# Patient Record
Sex: Female | Born: 1960 | Race: Black or African American | Hispanic: No | Marital: Single | Smoking: Never smoker
Health system: Southern US, Community
[De-identification: ages and names within clinical notes are randomized; demographics above are authoritative.]

## PROBLEM LIST (undated history)

## (undated) DIAGNOSIS — I10 Essential (primary) hypertension: Secondary | ICD-10-CM

## (undated) DIAGNOSIS — G809 Cerebral palsy, unspecified: Secondary | ICD-10-CM

## (undated) DIAGNOSIS — E7800 Pure hypercholesterolemia, unspecified: Secondary | ICD-10-CM

## (undated) DIAGNOSIS — E119 Type 2 diabetes mellitus without complications: Secondary | ICD-10-CM

## (undated) HISTORY — PX: DILATION AND CURETTAGE OF UTERUS: SHX78

---

## 2004-01-10 ENCOUNTER — Other Ambulatory Visit: Payer: Self-pay

## 2004-01-10 ENCOUNTER — Ambulatory Visit: Payer: Self-pay | Admitting: General Surgery

## 2004-01-11 ENCOUNTER — Ambulatory Visit: Payer: Self-pay | Admitting: General Surgery

## 2006-02-18 ENCOUNTER — Other Ambulatory Visit: Payer: Self-pay

## 2006-02-18 ENCOUNTER — Emergency Department: Payer: Self-pay | Admitting: Emergency Medicine

## 2006-09-19 ENCOUNTER — Ambulatory Visit: Payer: Self-pay | Admitting: Internal Medicine

## 2006-09-24 ENCOUNTER — Ambulatory Visit: Payer: Self-pay | Admitting: Internal Medicine

## 2007-02-24 ENCOUNTER — Other Ambulatory Visit: Payer: Self-pay

## 2007-02-24 ENCOUNTER — Ambulatory Visit: Payer: Self-pay | Admitting: Unknown Physician Specialty

## 2007-03-04 ENCOUNTER — Ambulatory Visit: Payer: Self-pay | Admitting: Unknown Physician Specialty

## 2007-06-23 ENCOUNTER — Ambulatory Visit: Payer: Self-pay | Admitting: Internal Medicine

## 2008-06-16 ENCOUNTER — Ambulatory Visit: Payer: Self-pay | Admitting: Internal Medicine

## 2009-10-20 ENCOUNTER — Ambulatory Visit: Payer: Self-pay | Admitting: Internal Medicine

## 2010-12-19 ENCOUNTER — Ambulatory Visit: Payer: Self-pay | Admitting: Internal Medicine

## 2011-12-20 ENCOUNTER — Ambulatory Visit: Payer: Self-pay | Admitting: Internal Medicine

## 2012-11-25 ENCOUNTER — Ambulatory Visit: Payer: Self-pay | Admitting: Obstetrics and Gynecology

## 2012-11-25 DIAGNOSIS — I10 Essential (primary) hypertension: Secondary | ICD-10-CM

## 2012-11-25 LAB — BASIC METABOLIC PANEL
Anion Gap: 8 (ref 7–16)
Calcium, Total: 9.1 mg/dL (ref 8.5–10.1)
Chloride: 99 mmol/L (ref 98–107)
Co2: 28 mmol/L (ref 21–32)
Creatinine: 0.64 mg/dL (ref 0.60–1.30)
EGFR (African American): >60
EGFR (Non-African Amer.): >60
Glucose: 254 mg/dL — ABNORMAL HIGH (ref 65–99)

## 2012-11-25 LAB — URINALYSIS, COMPLETE
Bacteria: NONE SEEN
Bilirubin,UR: NEGATIVE
Blood: NEGATIVE
Glucose,UR: >=500 mg/dL
Ketone: NEGATIVE
Leukocyte Esterase: NEGATIVE
Nitrite: NEGATIVE
Ph: 5
Protein: NEGATIVE
RBC,UR: 1 /HPF
Specific Gravity: 1.002
Squamous Epithelial: 1
WBC UR: 1 /HPF

## 2012-11-25 LAB — HEMOGLOBIN: HGB: 12.3 g/dL (ref 12.0–16.0)

## 2012-11-28 ENCOUNTER — Ambulatory Visit: Payer: Self-pay | Admitting: Obstetrics and Gynecology

## 2012-12-22 ENCOUNTER — Ambulatory Visit: Payer: Self-pay | Admitting: Obstetrics and Gynecology

## 2013-12-24 DIAGNOSIS — I10 Essential (primary) hypertension: Secondary | ICD-10-CM | POA: Insufficient documentation

## 2013-12-24 DIAGNOSIS — E119 Type 2 diabetes mellitus without complications: Secondary | ICD-10-CM | POA: Insufficient documentation

## 2013-12-24 DIAGNOSIS — G809 Cerebral palsy, unspecified: Secondary | ICD-10-CM | POA: Insufficient documentation

## 2013-12-24 DIAGNOSIS — E785 Hyperlipidemia, unspecified: Secondary | ICD-10-CM | POA: Insufficient documentation

## 2013-12-24 DIAGNOSIS — E669 Obesity, unspecified: Secondary | ICD-10-CM | POA: Insufficient documentation

## 2013-12-24 DIAGNOSIS — G629 Polyneuropathy, unspecified: Secondary | ICD-10-CM | POA: Insufficient documentation

## 2014-03-04 ENCOUNTER — Ambulatory Visit: Payer: Self-pay | Admitting: Internal Medicine

## 2014-05-28 ENCOUNTER — Ambulatory Visit: Payer: Self-pay | Admitting: Unknown Physician Specialty

## 2014-07-23 NOTE — Op Note (Signed)
PATIENT NAME:  Rachel [name], Rachel F MR#:  [name] DATE OF BIRTH:  01-29-1961  DATE OF PROCEDURE:  11/28/2012  PREOPERATIVE DIAGNOSES:  Polymenorrhea and menorrhagia.  POSTOPERATIVE DIAGNOSES:   Polymenorrhea and menorrhagia.  PROCEDURES: 1.  Hysteroscopy.  2.  Dilation and curettage. 3.  Attempted dermal ablation - failed cavity test.   SURGEON: Ricky L. Logan BoresEvans, M.D.   ANESTHESIA: General orotracheal.   SPECIMENS: Endometrial curettings.    FINDINGS:  Include a stenotic cervix. Approximately 100 mL of old dark Coutant blood that was released upon dilation of cervix. Hysteroscopy nonspecific.  Cavity length was 6.5 cm with leakage of CO2 at the cervical os causing cavity test to fail; unable to resolve with Vaseline gauze.   ESTIMATED BLOOD LOSS: Minimal.   COMPLICATIONS: None.   DRAINS: Red rubber at the end of the case. Approximately 200 mL of urine.   PROCEDURE IN DETAIL: The patient was consented. Preoperative antibiotics were given. She was taken to the operating room and placed in supine position where anesthesia was initiated and placed in dorsal lithotomy position using Allen stirrups. She was prepped and draped.  The cervix was visualized and grasped with a single-tooth tenaculum. Stenosis was overcome with the smallest dilators and easily dilated up to a 3617 [place] with hysteroscopic findings showing what appeared to be a large amount of old blood.  The old blood was drained. Polyp forceps and sharp curette were used to obtained portions of minimal to moderate amount of endometrium.  Then attempted NovaSure, but again failed the cavity test despite Vaseline gauze with persistent leakage around the cervix.    Sharp curetting was performed again with minimal return of tissue. The instruments were removed. The cervix seen to be hemostatic. The patient was returned to the supine position and back to the care of anesthesia.  The patient tolerated the procedure well. I suspect that a lot of  her prolonged bleeding was this cervical stenosis and this old trapped blood, most likely left over from her years on Provera and most likely the above procedure will resolve the problem.    She will be discharged home with routine prescriptions, precautions, and follow-up    ____________________________ Clide Clifficky L. Logan BoresEvans, [name] rle:dp D: 11/28/2012 08:15:37 ET T: 11/28/2012 10:00:14 ET JOB#: [id number]  cc: Ricky L. Logan BoresEvans, [name], <Dictator> Rachel [name] ELECTRONICALLY SIGNED 12/02/2012 9:15

## 2014-07-26 LAB — SURGICAL PATHOLOGY

## 2015-02-22 ENCOUNTER — Ambulatory Visit (INDEPENDENT_AMBULATORY_CARE_PROVIDER_SITE_OTHER): Payer: Medicare Other | Admitting: Podiatry

## 2015-02-22 ENCOUNTER — Encounter: Payer: Self-pay | Admitting: Podiatry

## 2015-02-22 ENCOUNTER — Ambulatory Visit (INDEPENDENT_AMBULATORY_CARE_PROVIDER_SITE_OTHER): Payer: Medicare Other

## 2015-02-22 VITALS — BP 164/96 | HR 93 | Resp 18

## 2015-02-22 DIAGNOSIS — R52 Pain, unspecified: Secondary | ICD-10-CM | POA: Diagnosis not present

## 2015-02-22 DIAGNOSIS — E1149 Type 2 diabetes mellitus with other diabetic neurological complication: Secondary | ICD-10-CM | POA: Diagnosis not present

## 2015-02-22 DIAGNOSIS — B351 Tinea unguium: Secondary | ICD-10-CM

## 2015-02-22 DIAGNOSIS — M19072 Primary osteoarthritis, left ankle and foot: Secondary | ICD-10-CM

## 2015-02-22 DIAGNOSIS — M79676 Pain in unspecified toe(s): Secondary | ICD-10-CM

## 2015-02-22 NOTE — Progress Notes (Signed)
   Subjective:    Patient ID: Rachel [name], female    DOB: 09-07-1960, 54 y.o.   MRN: [id number]  HPI  ETT-year-old female presents the office today with a caregiver for concerns of thick, painful, elongated toenails for which she is unable to trim herself. She also states that over the last couple months she has been having some pain in the top of her left foot mostly with weightbearing and standing for prolonged periods of time. She does get some burning to both of her feet but denies numbness. She denies any claudication symptoms. No other complaints at this time.   Review of Systems  Endocrine:       Excessive thirst   Neurological: Positive for headaches.  All other systems reviewed and are negative.      Objective:   Physical Exam General: AAO x3, NAD  Dermatological: Skin is warm, dry and supple bilateral. Nails are hypertrophic, dystrophic, brittle, discolored, elongated 10. There is tenderness to palpation overlying nails 1-5 bilaterally. No surrounding erythema or drainage. There are no open sores, no preulcerative lesions, no rash or signs of infection present. There are multiple discolored dark, uniform in color with irregular border dark patches the bottoms of her feet. There is no skin breakdown overlying the area and subjective this is not changed in quite some time.  Vascular: Dorsalis Pedis artery and Posterior Tibial artery pedal pulses are 2/4 bilateral with immedate capillary fill time. Pedal hair growth present. No varicosities and no lower extremity edema present bilateral. There is no pain with calf compression, swelling, warmth, erythema.   Neruologic: Sensation decreased with Dorann OuSimms Weinstein monofilament, vibratory sensation decreased, Achilles tendon reflex intact  Musculoskeletal: There is mild tenderness on palpation along the dorsal aspect left mid foot mostly on the medial aspect. There is no specific area pinpoint bony tenderness there is no pain vibratory  sensation. No other areas of tenderness to bilateral lower extremities. No gross boney pedal deformities bilateral. No pain, crepitus, or limitation noted with foot and ankle range of motion bilateral. Muscular strength 5/5 in all groups tested bilateral.  Gait: Unassisted, Nonantalgic.      Assessment & Plan:  54 year old female with symptomatic onychomycosis, left midfoot arthritis -X-rays were obtained and reviewed with the patient. Arthritic changes are present in the midfoot. No definitive acute fracture stress fracture. -Treatment options discussed including all alternatives, risks, and complications -Etiology of symptoms were discussed -Nails debrided 10 without complication/bleeding -Recommend diabetic shoes and inserts. Prescription for this was given to her for [employer]. -Discussed daily foot inspection -Follow-up with PCP in regards to possible started treatment for neuropathy. Also follow up other issues mentioned in the review of systems. -Follow-up in 3 months or after shoes/inserts if midfoot pain continues or sooner if any problems arise. In the meantime, encouraged to call the office with any questions, concerns, change in symptoms.   Ovid CurdMatthew Gideon Burstein, DPM

## 2015-05-27 ENCOUNTER — Encounter: Payer: Self-pay | Admitting: Emergency Medicine

## 2015-05-27 ENCOUNTER — Emergency Department: Payer: Medicare Other

## 2015-05-27 ENCOUNTER — Emergency Department
Admission: EM | Admit: 2015-05-27 | Discharge: 2015-05-27 | Disposition: A | Discharge status: Home / Self Care | Payer: Medicare Other | Attending: Student | Admitting: Student

## 2015-05-27 DIAGNOSIS — W01198A Fall on same level from slipping, tripping and stumbling with subsequent striking against other object, initial encounter: Secondary | ICD-10-CM | POA: Insufficient documentation

## 2015-05-27 DIAGNOSIS — W19XXXA Unspecified fall, initial encounter: Secondary | ICD-10-CM

## 2015-05-27 DIAGNOSIS — Y92129 Unspecified place in nursing home as the place of occurrence of the external cause: Secondary | ICD-10-CM | POA: Insufficient documentation

## 2015-05-27 DIAGNOSIS — F79 Unspecified intellectual disabilities: Secondary | ICD-10-CM | POA: Diagnosis not present

## 2015-05-27 DIAGNOSIS — R42 Dizziness and giddiness: Secondary | ICD-10-CM

## 2015-05-27 DIAGNOSIS — Z79899 Other long term (current) drug therapy: Secondary | ICD-10-CM | POA: Insufficient documentation

## 2015-05-27 DIAGNOSIS — S0990XA Unspecified injury of head, initial encounter: Secondary | ICD-10-CM | POA: Diagnosis not present

## 2015-05-27 DIAGNOSIS — Y9389 Activity, other specified: Secondary | ICD-10-CM | POA: Insufficient documentation

## 2015-05-27 DIAGNOSIS — Z3202 Encounter for pregnancy test, result negative: Secondary | ICD-10-CM | POA: Insufficient documentation

## 2015-05-27 DIAGNOSIS — E119 Type 2 diabetes mellitus without complications: Secondary | ICD-10-CM | POA: Diagnosis not present

## 2015-05-27 DIAGNOSIS — Z7984 Long term (current) use of oral hypoglycemic drugs: Secondary | ICD-10-CM | POA: Diagnosis not present

## 2015-05-27 DIAGNOSIS — Y998 Other external cause status: Secondary | ICD-10-CM | POA: Insufficient documentation

## 2015-05-27 DIAGNOSIS — R002 Palpitations: Secondary | ICD-10-CM | POA: Insufficient documentation

## 2015-05-27 DIAGNOSIS — Z794 Long term (current) use of insulin: Secondary | ICD-10-CM | POA: Diagnosis not present

## 2015-05-27 DIAGNOSIS — I10 Essential (primary) hypertension: Secondary | ICD-10-CM | POA: Diagnosis not present

## 2015-05-27 HISTORY — DX: Type 2 diabetes mellitus without complications: E11.9

## 2015-05-27 HISTORY — DX: Pure hypercholesterolemia, unspecified: E78.00

## 2015-05-27 HISTORY — DX: Essential (primary) hypertension: I10

## 2015-05-27 LAB — URINALYSIS COMPLETE WITH MICROSCOPIC (ARMC ONLY)
Bilirubin Urine: NEGATIVE
Glucose, UA: >500 mg/dL — AB
Hgb urine dipstick: NEGATIVE
Ketones, ur: NEGATIVE mg/dL
LEUKOCYTES UA: NEGATIVE
Nitrite: NEGATIVE
PH: 7 (ref 5.0–8.0)
PROTEIN: NEGATIVE mg/dL
Specific Gravity, Urine: 1.002 — ABNORMAL LOW (ref 1.005–1.030)

## 2015-05-27 LAB — HCG, QUANTITATIVE, PREGNANCY: hCG, Beta Chain, Quant, S: <1 m[IU]/mL (ref <5)

## 2015-05-27 LAB — CBC
HCT: 35 % (ref 35.0–47.0)
HEMOGLOBIN: 11.6 g/dL — AB (ref 12.0–16.0)
MCH: 31.4 pg (ref 26.0–34.0)
MCHC: 33.1 g/dL (ref 32.0–36.0)
MCV: 94.7 fL (ref 80.0–100.0)
PLATELETS: 235 10*3/uL (ref 150–440)
RBC: 3.69 MIL/uL — AB (ref 3.80–5.20)
RDW: 12.8 % (ref 11.5–14.5)
WBC: 7.3 10*3/uL (ref 3.6–11.0)

## 2015-05-27 LAB — COMPREHENSIVE METABOLIC PANEL
ALK PHOS: 90 U/L (ref 38–126)
ALT: 17 U/L (ref 14–54)
AST: 23 U/L (ref 15–41)
Albumin: 3.9 g/dL (ref 3.5–5.0)
Anion gap: 8 (ref 5–15)
BUN: 8 mg/dL (ref 6–20)
CHLORIDE: 97 mmol/L — AB (ref 101–111)
CO2: 27 mmol/L (ref 22–32)
CREATININE: 0.74 mg/dL (ref 0.44–1.00)
Calcium: 8.9 mg/dL (ref 8.9–10.3)
GFR calc Af Amer: >60 mL/min (ref >60)
Glucose, Bld: 242 mg/dL — ABNORMAL HIGH (ref 65–99)
Potassium: 4 mmol/L (ref 3.5–5.1)
Sodium: 132 mmol/L — ABNORMAL LOW (ref 135–145)
Total Bilirubin: 0.4 mg/dL (ref 0.3–1.2)
Total Protein: 7.3 g/dL (ref 6.5–8.1)

## 2015-05-27 LAB — GLUCOSE, CAPILLARY: GLUCOSE-CAPILLARY: 235 mg/dL — AB (ref 65–99)

## 2015-05-27 LAB — TROPONIN I

## 2015-05-27 NOTE — ED Notes (Signed)
Patient from "[facility]" group home. Patient fell this morning at 0620 striking her head. Group home sent her to [facility] for evaluation. [facility] directed her to the ED for evaluation of Hypoglycemia (50). However, patient states she had an acute onset of chest pain and palpitations resulting in dizziness/lightheadedness prior to the fall.

## 2015-05-27 NOTE — ED Provider Notes (Signed)
[facility] Emergency Department Provider Note  ____________________________________________  Time seen: Approximately 1:00 PM  I have reviewed the triage vital signs and the nursing notes.   HISTORY  Chief Complaint Dizziness; Chest Pain; Palpitations; Fall; and Head Injury  Caveat-history of present illness and review of systems Limited due to the patient's mental retardation. Information is obtained partially from the patient as well as her caregiver at bedside.  HPI Rachel [name] is a 55 y.o. female with history of mental retardation, hyperlipidemia, hypertension, diabetes presents for evaluation of a fall which occurred this morning suddenly, gradual onset, now resolved, initially moderate to severe. The caregiver reports that she heard a "thud" in the next room at the group home. She went into the patient's room and found her awake on the floor and she appeared to have hit her head. The patient reported to the caregiver that she was trying to get a pencil off of the TV when she got dizzy and her heart was fluttering. She is unable to specify whether not she was lightheaded or whether or not this was room spinning dizziness. Patient denies any chest pain specifically. Currently she reports she feels well. There was no loss of consciousness. The patient has otherwise been in her usual state of health without illness. No vomiting, diarrhea, fevers or chills.   Past Medical History  Diagnosis Date  . Diabetes mellitus without complication (HCC)   . Hypertension   . Hypercholesteremia     There are no active problems to display for this patient.   History reviewed. No pertinent past surgical history.  Current Outpatient Rx  Name  Route  Sig  Dispense  Refill  . amLODipine (NORVASC) 10 MG tablet   Oral   Take by mouth.         Marland Kitchen amLODipine (NORVASC) 10 MG tablet               . amLODipine (NORVASC) 5 MG tablet               . ARIPiprazole  (ABILIFY) 10 MG tablet   Oral   Take by mouth.         . carbamazepine (TEGRETOL) 200 MG tablet   Oral   Take by mouth.         . chlorthalidone (HYGROTON) 25 MG tablet      1 tab PO QOD         . furosemide (LASIX) 40 MG tablet   Oral   Take by mouth.         Marland Kitchen glipiZIDE (GLUCOTROL XL) 10 MG 24 hr tablet   Oral   Take by mouth.         . Insulin Glargine (LANTUS SOLOSTAR) 100 UNIT/ML Solostar Pen      INJECT 40 UNITS SQ EVERY NIGHT AT BEDTIME. (DIABETES)         . Insulin Pen Needle (BD AUTOSHIELD DUO) 30G X 5 MM MISC      Use 1 Pen Needle once daily.         Marland Kitchen loratadine (CLARITIN) 10 MG tablet   Oral   Take by mouth.         . losartan (COZAAR) 100 MG tablet   Oral   Take by mouth.         . losartan (COZAAR) 50 MG tablet               . magnesium oxide (MAG-OX) 400 MG tablet  Oral   Take by mouth.         . medroxyPROGESTERone (DEPO-PROVERA) 150 MG/ML injection   Intramuscular   Inject into the muscle.         . medroxyPROGESTERone (DEPO-PROVERA) 150 MG/ML injection               . metFORMIN (GLUCOPHAGE) 1000 MG tablet   Oral   Take by mouth.         . metFORMIN (GLUCOPHAGE) 1000 MG tablet               . Multiple Vitamin (THEREMS) TABS   Oral   Take by mouth.         . nystatin cream (MYCOSTATIN)               . nystatin-triamcinolone (MYCOLOG II) cream   Topical   Apply topically.         . pioglitazone (ACTOS) 30 MG tablet   Oral   Take by mouth.         . potassium chloride SA (K-DUR,KLOR-CON) 20 MEQ tablet   Oral   Take by mouth.         . pravastatin (PRAVACHOL) 80 MG tablet      TAKE ONE TABLET BY MOUTH AT BEDTIME. (IMPROVES CHOLESTEROL)         . prazosin (MINIPRESS) 2 MG capsule   Oral   Take by mouth.         . ranitidine (ZANTAC) 150 MG tablet      TAKE ONE TABLET BY MOUTH TWICE DAILY FOR STOMACH         . ranitidine (ZANTAC) 150 MG tablet               .  senna-docusate (DOC-Q-LAX) 8.6-50 MG tablet   Oral   Take by mouth.         . triamcinolone cream (KENALOG) 0.1 %                 Allergies Review of patient's allergies indicates no known allergies.  History reviewed. No pertinent family history.  Social History Social History  Substance Use Topics  . Smoking status: Never Smoker   . Smokeless tobacco: Never Used  . Alcohol Use: None    Review of Systems Constitutional: No fever/chills Cardiovascular: Denies chest pain. Respiratory: Denies shortness of breath. Gastrointestinal: No abdominal pain.  No nausea, no vomiting.  No diarrhea.   Genitourinary: Negative for dysuria. Skin: Negative for rash.  Caveat-history of present illness and review of systems Limited due to the patient's mental retardation. Information is obtained partially from the patient as well as her caregiver at bedside. ____________________________________________   PHYSICAL EXAM:  VITAL SIGNS: ED Triage Vitals  Enc Vitals Group     BP 05/27/15 1048 164/70 mmHg     Pulse Rate 05/27/15 1048 95     Resp 05/27/15 1048 18     Temp 05/27/15 1048 98.1 F (36.7 C)     Temp Source 05/27/15 1048 Oral     SpO2 05/27/15 1048 100 %     Weight 05/27/15 1048 221 lb (100.245 kg)     Height --      Head Cir --      Peak Flow --      Pain Score 05/27/15 1239 0     Pain Loc --      Pain Edu? --      Excl. in GC? --  Constitutional: Alert and oriented to self and place but not to year, which is her baseline. She does appear cognitively impaired however she participates with the exam and is able to answer most simple questions appropriately though she does often give inconsistent answers. Eyes: Conjunctivae are normal. PERRL. EOMI. Head: Atraumatic. Nose: No congestion/rhinnorhea. Mouth/Throat: Mucous membranes are moist.  Oropharynx non-erythematous. Neck: No stridor.  No cervical spine tenderness to palpation. Cardiovascular: Normal rate, regular  rhythm. Grossly normal heart sounds.  Good peripheral circulation. Respiratory: Normal respiratory effort.  No retractions. Lungs CTAB. Gastrointestinal: Soft and nontender. No distention.  No CVA tenderness. Genitourinary: deferred Musculoskeletal: No lower extremity tenderness nor edema.  No joint effusions. Neurologic:  Normal speech and language. No gross focal neurologic deficits are appreciated. No gait instability. 5 out of 5 strength in bilateral upper and lower extremities, sensation intact to light touch throughout. Cranial nerves II through XII intact. Normal finger-nose-finger. Skin:  Skin is warm, dry and intact. No rash noted. Psychiatric: Mood and affect are normal. Speech and behavior are normal.  ____________________________________________   LABS (all labs ordered are listed, but only abnormal results are displayed)  Labs Reviewed  GLUCOSE, CAPILLARY - Abnormal; Notable for the following:    Glucose-Capillary 235 (*)    All other components within normal limits  CBC - Abnormal; Notable for the following:    RBC 3.69 (*)    Hemoglobin 11.6 (*)    All other components within normal limits  COMPREHENSIVE METABOLIC PANEL - Abnormal; Notable for the following:    Sodium 132 (*)    Chloride 97 (*)    Glucose, Bld 242 (*)    All other components within normal limits  URINALYSIS COMPLETEWITH MICROSCOPIC ([facility] ONLY) - Abnormal; Notable for the following:    Color, Urine STRAW (*)    APPearance CLEAR (*)    Glucose, UA >500 (*)    Specific Gravity, Urine 1.002 (*)    Bacteria, UA RARE (*)    Squamous Epithelial / LPF 0-5 (*)    All other components within normal limits  TROPONIN I  HCG, QUANTITATIVE, PREGNANCY   ____________________________________________  EKG  ED ECG REPORT I, Gayla Doss, the attending physician, personally viewed and interpreted this ECG.   Date: 05/27/2015  EKG Time: 11:02  Rate: 97  Rhythm: normal sinus rhythm  Axis: normal   Intervals:none  ST&T Change: No acute ST elevation. Q waves in lead 3, V3 are chronic. There is a Q wave noted in aVF however overall the appearance of the EKG is similar to that obtained on 11/25/2012.  ____________________________________________  RADIOLOGY  CT head  IMPRESSION: No acute intracranial abnormality.  CXR IMPRESSION: No acute cardiopulmonary disease.  ____________________________________________   PROCEDURES  Procedure(s) performed: None  Critical Care performed: No  ____________________________________________   INITIAL IMPRESSION / ASSESSMENT AND PLAN / ED COURSE  Pertinent labs & imaging results that were available during my care of the patient were reviewed by me and considered in my medical decision making (see chart for details).   Shawnee Gambone is a 55 y.o. female with history of mental retardation, hyperlipidemia, hypertension, diabetes presents for evaluation of a fall which occurred this morning which was preceded by some nonspecific palpitations and dizziness. On exam, patient is very well-appearing and in no acute distress. Vital signs stable, she is afebrile. She has an intact neurological exam and her exam is atraumatic. Plan for screening labs, chest x-ray, CT head and will observe on cardiac monitor.  Reassess for disposition.  ----------------------------------------- 3:30 PM on 05/27/2015 ----------------------------------------- The patient has been observed on the cardiac monitor several hours without any documented clinically significant arrhythmia. EKG also normal sinus rhythm. Labs reviewed and are notable for mild anemia, mild hyponatremia. Troponin negative. Urinalysis is not consistent with infection and the patient does not appear to be pregnant. CT head and chest x-ray are negative for any acute pathology. Currently the patient is asymptomatic. I doubt CVA, ACS, acute aortic dissection, PE or series bacterial illness. Given her  complaint of palpitations, I discussed the case with Dr. Kirke Corin who will help to ensure close follow-up next week. At the time of discharge, she is mildly tachypneic which she has not been previously and I suspect this is because she appears quite anxious to leave and she and her caregiver or pacing the room. We'll discharge with return precautions and close cardiology follow-up.  ____________________________________________   FINAL CLINICAL IMPRESSION(S) / ED DIAGNOSES  Final diagnoses:  Heart palpitations  Dizzy  Fall, initial encounter  Head injury due to trauma, initial encounter      Gayla Doss, MD 05/27/15 1544

## 2015-06-01 ENCOUNTER — Encounter: Payer: Self-pay | Admitting: Cardiology

## 2015-06-01 ENCOUNTER — Ambulatory Visit (INDEPENDENT_AMBULATORY_CARE_PROVIDER_SITE_OTHER): Payer: Medicare Other | Admitting: Cardiology

## 2015-06-01 ENCOUNTER — Encounter (INDEPENDENT_AMBULATORY_CARE_PROVIDER_SITE_OTHER): Payer: Self-pay

## 2015-06-01 VITALS — BP 120/76 | HR 84 | Ht 66.5 in | Wt 219.8 lb

## 2015-06-01 DIAGNOSIS — R011 Cardiac murmur, unspecified: Secondary | ICD-10-CM | POA: Diagnosis not present

## 2015-06-01 DIAGNOSIS — I498 Other specified cardiac arrhythmias: Secondary | ICD-10-CM

## 2015-06-01 DIAGNOSIS — R002 Palpitations: Secondary | ICD-10-CM | POA: Diagnosis not present

## 2015-06-01 DIAGNOSIS — R42 Dizziness and giddiness: Secondary | ICD-10-CM

## 2015-06-01 NOTE — Patient Instructions (Signed)
Medication Instructions:  Your physician recommends that you continue on your current medications as directed. Please refer to the Current Medication list given to you today.   Labwork: None ordered  Testing/Procedures: Your physician has recommended that you wear a holter monitor. Holter monitors are medical devices that record the heart's electrical activity. Doctors most often use these monitors to diagnose arrhythmias. Arrhythmias are problems with the speed or rhythm of the heartbeat. The monitor is a small, portable device. You can wear one while you do your normal daily activities. This is usually used to diagnose what is causing palpitations/syncope (passing out).  Date & Time: __________________________________________   Your physician has requested that you have an echocardiogram. Echocardiography is a painless test that uses sound waves to create images of your heart. It provides your doctor with information about the size and shape of your heart and how well your heart's chambers and valves are working. This procedure takes approximately one hour. There are no restrictions for this procedure.  Date & Time: ____________________________________________   Your physician has requested that you regularly monitor and record your blood pressure readings at home. Please use the same machine at the same time of day to check your readings and record them to bring to your follow-up visit.    Follow-Up: Your physician recommends that you schedule a follow-up appointment after testing.   Date & Time: ______________________________________   Any Other Special Instructions Will Be Listed Below (If Applicable).     If you need a refill on your cardiac medications before your next appointment, please call your pharmacy.

## 2015-06-01 NOTE — Progress Notes (Signed)
Cardiology Office Note   Date:  06/01/2015   ID:  Rachel [name], DOB [birth year], MRN [id number]  Referring Doctor:  Rafael Bihari, MD   Cardiologist:   Almond Lint, MD   Reason for consultation:  Chief Complaint  Patient presents with  . other    F/u ED due to heart flutter and dizziness. Meds reviewed verbally with pt.      History of Present Illness: Rachel [name] is a 55 y.o. female who presents for evaluation of palpitations.  Patient is a poor historian due to history of cerebral palsy. With the help of the care worker, she reports that she had an episode of dizziness and fluttering sensation in her heart 05/27/2015. She does not give a detailed description of the event but says that she felt dizzy with room spinning. At the same time, she felt her heart racing. She was noted to fall to the ground hit her face. There is no evidence of loss of consciousness. She was brought to the ER for further evaluation.  It appears that palpitations may have been going on for quite some time now, maybe several months. Palpitations occur with mopping and exercising. Severity is mild to moderate. Patient cannot describe how long the episodes occur for. Symptoms are focused on the chest, nonradiating. Symptoms resolved spontaneously.  She does not report any chest pain, loss of consciousness, cough, colds, orthopnea, PND, shortness of breath, bleeding.   ROS:  Please see the history of present illness. Aside from mentioned under HPI, all other systems are reviewed and negative.     Past Medical History  Diagnosis Date  . Diabetes mellitus without complication (HCC)   . Hypertension   . Hypercholesteremia     Past Surgical History  Procedure Laterality Date  . Dilation and curettage of uterus       reports that she has never smoked. She has never used smokeless tobacco. She reports that she does not drink alcohol or use illicit drugs.   family history is not on file.  family history unknown to patient due to her cerebral palsy.  Current Outpatient Prescriptions  Medication Sig Dispense Refill  . amLODipine (NORVASC) 10 MG tablet Take 10 mg by mouth daily.     . ARIPiprazole (ABILIFY) 10 MG tablet Take 10 mg by mouth daily.     . carbamazepine (TEGRETOL) 200 MG tablet Take 400 mg by mouth 2 (two) times daily.     . chlorthalidone (HYGROTON) 25 MG tablet 1 tab PO QOD    . furosemide (LASIX) 40 MG tablet Take 40 mg by mouth as needed.     Marland Kitchen glipiZIDE (GLUCOTROL XL) 10 MG 24 hr tablet Take 10 mg by mouth daily with breakfast.     . hydrALAZINE (APRESOLINE) 50 MG tablet Take 50 mg by mouth 3 (three) times daily.    . Insulin Glargine (LANTUS SOLOSTAR) 100 UNIT/ML Solostar Pen INJECT 40 UNITS SQ EVERY NIGHT AT BEDTIME. (DIABETES)    . Insulin Pen Needle (BD AUTOSHIELD DUO) 30G X 5 MM MISC Use 1 Pen Needle once daily.    Marland Kitchen loratadine (CLARITIN) 10 MG tablet Take 10 mg by mouth daily.     Marland Kitchen losartan (COZAAR) 100 MG tablet Take 100 mg by mouth daily.     . magnesium oxide (MAG-OX) 400 MG tablet Take 400 mg by mouth daily.     . medroxyPROGESTERone (DEPO-PROVERA) 150 MG/ML injection Inject into the muscle.    Marland Kitchen  metFORMIN (GLUCOPHAGE) 1000 MG tablet Take 1,000 mg by mouth 2 (two) times daily with a meal.     . Multiple Vitamin (THEREMS) TABS Take by mouth.    . nystatin-triamcinolone (MYCOLOG II) cream Apply 1 application topically 2 (two) times daily.     . ondansetron (ZOFRAN) 4 MG tablet Take 4 mg by mouth 2 (two) times daily.    . pioglitazone (ACTOS) 30 MG tablet Take 30 mg by mouth daily.     . potassium chloride SA (K-DUR,KLOR-CON) 20 MEQ tablet Take 20 mEq by mouth 2 (two) times daily.     . pravastatin (PRAVACHOL) 80 MG tablet TAKE ONE TABLET BY MOUTH AT BEDTIME. (IMPROVES CHOLESTEROL)    . prazosin (MINIPRESS) 2 MG capsule Take 2 mg by mouth 2 (two) times daily.     . ranitidine (ZANTAC) 150 MG tablet TAKE ONE TABLET BY MOUTH TWICE DAILY FOR STOMACH    .  senna-docusate (DOC-Q-LAX) 8.6-50 MG tablet Take 1 tablet by mouth 2 (two) times daily.     Marland Kitchen triamcinolone cream (KENALOG) 0.1 %      No current facility-administered medications for this visit.    Allergies: Ace inhibitors    PHYSICAL EXAM: VS:  BP 120/76 mmHg  Pulse 84  Ht 5' 6.5" (1.689 m)  Wt 219 lb 12 oz (99.678 kg)  BMI 34.94 kg/m2 , Body mass index is 34.94 kg/(m^2).  Orthostatic VS for the past 24 hrs:  BP- Lying Pulse- Lying BP- Sitting Pulse- Sitting BP- Standing at 0 minutes Pulse- Standing at 0 minutes  06/01/15 0942 120/76 mmHg 80 120/77 mmHg 84 122/76 mmHg 99      Wt Readings from Last 3 Encounters:  06/01/15 219 lb 12 oz (99.678 kg)  05/27/15 221 lb (100.245 kg)    GENERAL:  well developed, well nourished,  obese, not in acute distress HEENT: normocephalic, pink conjunctivae, anicteric sclerae, no xanthelasma, normal dentition, oropharynx clear NECK:  no neck vein engorgement, JVP normal, no hepatojugular reflux, carotid upstroke brisk and symmetric, no bruit, no thyromegaly, no lymphadenopathy LUNGS:  good respiratory effort, clear to auscultation bilaterally CV:  PMI not displaced, no thrills, no lifts, S1 and S2 within normal limits, no palpable S3 or S4, no rubs, no gallops, soft, 2 out of 6, systolic ejection murmur heard best at the base ABD:  Soft, nontender, nondistended, normoactive bowel sounds, no abdominal aortic bruit, no hepatomegaly, no splenomegaly MS: nontender back, no kyphosis, no scoliosis, no joint deformities EXT:  2+ DP/PT pulses, no edema, no varicosities, no cyanosis, no clubbing SKIN: warm, nondiaphoretic, normal turgor, no ulcers NEUROPSYCH: alert, oriented to person, place, and time, sensory/motor grossly intact, normal mood, appropriate affect  Recent Labs: 05/27/2015: ALT 17; BUN 8; Creatinine, Ser 0.74; Hemoglobin 11.6*; Platelets 235; Potassium 4.0; Sodium 132*   Lipid Panel No results found for: CHOL, TRIG, HDL, CHOLHDL, VLDL,  LDLCALC, LDLDIRECT   Other studies Reviewed:  EKG:  EKG is ordered today. The ekg ordered today was personally reviewed by me and it reveals sinus rhythm, 84 BPM. Minimal voltage criteria for LVH.  Additional studies/ records that were reviewed personally reviewed by me today include: Not available   ASSESSMENT AND PLAN:  1. Palpitations - recommend 24-hour Holter monitor, echo.  2. Hypertension - blood pressure today appears well-controlled. Reviewing orthostatic vital signs shows elevation of heart rate with standing up. Recommend closely monitoring blood pressure and BP log. Recommend holding off chlorthalidone for now. This may be contributing to hyponatremia as  well. We'll recommend to repeat BMP on follow-up visit.  3. Obesity - Body mass index is 34.94 kg/(m^2).Marland Kitchen Recommend aggressive weight loss through diet and increased physical activity.   4. Hyperlipidemia - patient on pravastatin. The atenolol being followed by PCP.  Current medicines are reviewed at length with the patient today.  The patient does not have concerns regarding medicines.  Labs/ tests ordered today include:  Orders Placed This Encounter  Procedures  . Holter monitor - 24 hour  . EKG 12-Lead  . Echocardiogram    I had a lengthy and detailed discussion with the patient regarding diagnoses, prognosis, diagnostic options, treatment options, and side effects of medications.   I counseled the patient on importance of lifestyle modification including heart healthy diet, regular physical activity. Disposition:   FU with undersigned after tests.   Signed, Almond Lint, MD  06/01/2015 10:21 AM    [facility]

## 2015-06-14 ENCOUNTER — Other Ambulatory Visit: Payer: Medicare Other

## 2015-06-27 ENCOUNTER — Other Ambulatory Visit: Payer: Self-pay

## 2015-06-27 ENCOUNTER — Encounter (INDEPENDENT_AMBULATORY_CARE_PROVIDER_SITE_OTHER): Payer: Self-pay

## 2015-06-27 ENCOUNTER — Ambulatory Visit (INDEPENDENT_AMBULATORY_CARE_PROVIDER_SITE_OTHER): Payer: Medicare Other

## 2015-06-27 DIAGNOSIS — R011 Cardiac murmur, unspecified: Secondary | ICD-10-CM

## 2015-06-27 DIAGNOSIS — R002 Palpitations: Secondary | ICD-10-CM

## 2015-06-30 ENCOUNTER — Ambulatory Visit
Admission: RE | Admit: 2015-06-30 | Discharge: 2015-06-30 | Disposition: A | Discharge status: Home / Self Care | Payer: Medicare Other | Source: Ambulatory Visit | Attending: Cardiology | Admitting: Cardiology

## 2015-06-30 DIAGNOSIS — R42 Dizziness and giddiness: Secondary | ICD-10-CM | POA: Insufficient documentation

## 2015-06-30 DIAGNOSIS — I498 Other specified cardiac arrhythmias: Secondary | ICD-10-CM | POA: Diagnosis present

## 2015-06-30 DIAGNOSIS — I493 Ventricular premature depolarization: Secondary | ICD-10-CM | POA: Insufficient documentation

## 2015-07-06 ENCOUNTER — Ambulatory Visit (INDEPENDENT_AMBULATORY_CARE_PROVIDER_SITE_OTHER): Payer: Medicare Other | Admitting: Cardiology

## 2015-07-06 ENCOUNTER — Encounter: Payer: Self-pay | Admitting: Cardiology

## 2015-07-06 VITALS — BP 118/60 | HR 88 | Ht 67.0 in | Wt 223.8 lb

## 2015-07-06 DIAGNOSIS — R002 Palpitations: Secondary | ICD-10-CM

## 2015-07-06 DIAGNOSIS — I10 Essential (primary) hypertension: Secondary | ICD-10-CM | POA: Diagnosis not present

## 2015-07-06 DIAGNOSIS — E669 Obesity, unspecified: Secondary | ICD-10-CM

## 2015-07-06 DIAGNOSIS — E785 Hyperlipidemia, unspecified: Secondary | ICD-10-CM

## 2015-07-06 NOTE — Patient Instructions (Signed)
Medication Instructions:  Your physician has recommended you make the following change in your medication:  1. STOP taking chlorthalidone (Hygroton)   Labwork: None Ordered  Testing/Procedures: None Ordered  Follow-Up: Your physician recommends that you schedule a follow-up appointment in: 1 month with Dr. Alvino ChapelIngal  Date & Time:____________________________________________________________________   Any Other Special Instructions Will Be Listed Below (If Applicable). Your physician has requested that you regularly monitor and record your blood pressure readings at home. Please use the same machine at the same time of day to check your readings and record them to bring to your follow-up visit.  BRING TO NEXT OFFICE VISIT    If you need a refill on your cardiac medications before your next appointment, please call your pharmacy.

## 2015-07-06 NOTE — Progress Notes (Signed)
Cardiology Office Note   Date:  07/06/2015   ID:  Rachel [name], DOB 06/30/1960, MRN [id number]  Referring Doctor:  Rafael Bihari, MD   Cardiologist:   Almond Lint, MD   Reason for consultation:  Chief Complaint  Patient presents with  . other    Follow up from Echo and 24 hour holter monitor.  Meds reviewed by the pt's med list. "doing well."       History of Present Illness: Rachel [name] is a 55 y.o. female who presents for Follow-up after tests  Per patient, she has had no more episodes of dizziness or palpitations. Her symptom currently is occasional headache. She will be checking in with her primary care physician regarding this.  Again, no complaints of shortness of breath or chest pain.  In terms of her hypertension, no available blood pressure log for review. She still on the same medications as before.  In terms of hyperlipidemia, she continues to take pravastatin.  ROS:  Please see the history of present illness. Aside from mentioned under HPI, all other systems are reviewed and negative.     Past Medical History  Diagnosis Date  . Diabetes mellitus without complication (HCC)   . Hypertension   . Hypercholesteremia     Past Surgical History  Procedure Laterality Date  . Dilation and curettage of uterus       reports that she has never smoked. She has never used smokeless tobacco. She reports that she does not drink alcohol or use illicit drugs.   Family history is unknown by patient. family history unknown to patient due to her cerebral palsy.  Current Outpatient Prescriptions  Medication Sig Dispense Refill  . amLODipine (NORVASC) 10 MG tablet Take 10 mg by mouth daily.     . ARIPiprazole (ABILIFY) 10 MG tablet Take 10 mg by mouth daily.     . carbamazepine (TEGRETOL) 200 MG tablet Take 400 mg by mouth 2 (two) times daily.     . furosemide (LASIX) 40 MG tablet Take 40 mg by mouth as needed.     Marland Kitchen glipiZIDE (GLUCOTROL XL) 10 MG 24 hr  tablet Take 10 mg by mouth daily with breakfast.     . hydrALAZINE (APRESOLINE) 50 MG tablet Take 50 mg by mouth 3 (three) times daily.    . Insulin Glargine (LANTUS SOLOSTAR) 100 UNIT/ML Solostar Pen INJECT 40 UNITS SQ EVERY NIGHT AT BEDTIME. (DIABETES)    . Insulin Pen Needle (BD AUTOSHIELD DUO) 30G X 5 MM MISC Use 1 Pen Needle once daily.    Marland Kitchen loratadine (CLARITIN) 10 MG tablet Take 10 mg by mouth daily.     Marland Kitchen losartan (COZAAR) 100 MG tablet Take 100 mg by mouth daily.     . magnesium oxide (MAG-OX) 400 MG tablet Take 400 mg by mouth daily.     . metFORMIN (GLUCOPHAGE) 1000 MG tablet Take 1,000 mg by mouth 2 (two) times daily with a meal.     . Multiple Vitamin (THEREMS) TABS Take by mouth.    . nystatin-triamcinolone (MYCOLOG II) cream Apply 1 application topically 2 (two) times daily.     . ondansetron (ZOFRAN) 4 MG tablet Take 4 mg by mouth 2 (two) times daily.    . pioglitazone (ACTOS) 30 MG tablet Take 30 mg by mouth daily.     . potassium chloride SA (K-DUR,KLOR-CON) 20 MEQ tablet Take 20 mEq by mouth 2 (two) times daily.     Marland Kitchen  pravastatin (PRAVACHOL) 80 MG tablet TAKE ONE TABLET BY MOUTH AT BEDTIME. (IMPROVES CHOLESTEROL)    . prazosin (MINIPRESS) 2 MG capsule Take 2 mg by mouth 2 (two) times daily.     . ranitidine (ZANTAC) 150 MG tablet TAKE ONE TABLET BY MOUTH TWICE DAILY FOR STOMACH    . senna-docusate (DOC-Q-LAX) 8.6-50 MG tablet Take 1 tablet by mouth 2 (two) times daily.     Marland Kitchen. triamcinolone cream (KENALOG) 0.1 %      No current facility-administered medications for this visit.    Allergies: Ace inhibitors    PHYSICAL EXAM: VS:  BP 118/60 mmHg  Pulse 88  Ht 5\' 7"  (1.702 m)  Wt 223 lb 12 oz (101.492 kg)  BMI 35.04 kg/m2 , Body mass index is 35.04 kg/(m^2).  No data found.     Wt Readings from Last 3 Encounters:  07/06/15 223 lb 12 oz (101.492 kg)  06/01/15 219 lb 12 oz (99.678 kg)  05/27/15 221 lb (100.245 kg)    GENERAL:  well developed, well nourished,   obese, not in acute distress HEENT: normocephalic, pink conjunctivae, anicteric sclerae, no xanthelasma, normal dentition, oropharynx clear NECK:  no neck vein engorgement, JVP normal, no hepatojugular reflux, carotid upstroke brisk and symmetric, no bruit, no thyromegaly, no lymphadenopathy LUNGS:  good respiratory effort, clear to auscultation bilaterally CV:  PMI not displaced, no thrills, no lifts, S1 and S2 within normal limits, no palpable S3 or S4, no rubs, no gallops, soft, 2 out of 6, systolic ejection murmur heard best at the base ABD:  Soft, nontender, nondistended, normoactive bowel sounds, no abdominal aortic bruit, no hepatomegaly, no splenomegaly MS: nontender back, no kyphosis, no scoliosis, no joint deformities EXT:  2+ DP/PT pulses, no edema, no varicosities, no cyanosis, no clubbing SKIN: warm, nondiaphoretic, normal turgor, no ulcers NEUROPSYCH: alert, oriented to person, place, and time, sensory/motor grossly intact, normal mood, appropriate affect  Recent Labs: 05/27/2015: ALT 17; BUN 8; Creatinine, Ser 0.74; Hemoglobin 11.6*; Platelets 235; Potassium 4.0; Sodium 132*   Lipid Panel No results found for: CHOL, TRIG, HDL, CHOLHDL, VLDL, LDLCALC, LDLDIRECT   Other studies Reviewed:  EKG:   The ekg from 06/01/2015 was personally reviewed by me and it revealed sinus rhythm, 84 BPM. Minimal voltage criteria for LVH.  Additional studies/ records that were reviewed personally reviewed by me today include:   Echocardiogram 06/27/2015: Left ventricle: The cavity size was normal. Systolic function was  normal. The estimated ejection fraction was in the range of 60%  to 65%. Wall motion was normal; there were no regional wall  motion abnormalities. Doppler parameters are consistent with  abnormal left ventricular relaxation (grade 1 diastolic  dysfunction). - Left atrium: The atrium was normal in size. - Right ventricle: Systolic function was normal. - Pulmonary arteries:  Systolic pressure was within the normal  range. - Inferior vena cava: The vessel was normal in size. The  respirophasic diameter changes were in the normal range (>= 50%),  consistent with normal central venous pressure.  Holter 06/27/2015:  Sinus rhythm  Average of 84 BPM  20% of total number of beats were sinus tachycardia  No high grade supraventricular or ventricular ectopy  Indication for study: Palpitations  Holter monitor from 06/27/2015 revealed overall rhythm to be sinus. Heart rate ranged from 57-156 BPM, average of 84 BPM. Maximum heart rate was at 9:39 AM. No symptom recorded. 20% of the total number of beats were sinus tachycardia. Maximum RR interval was normal at 1.4  seconds  In terms of supraventricular ectopy: Isolated PACs.  In terms of ventricular ectopy: 2 isolated PVCs.  No documented atrial fibrillation.  ASSESSMENT AND PLAN:  Palpitations  Holter from 06/27/2015 showed sinus rhythm, average of 84 BPM. Maximum heart rate was 156 BPM, no symptom recorded. No high grade ventricular ectopy or supraventricular ectopy. No A. Fib. Echocardiogram from 06/27/2015 showed normal heart function. Overall findings were within normal limits. Patient has had no recurrence of palpitations. Sinus tachycardia may be compensatory to activity.  Hypertension - blood pressure today appears well-controlled.  Recommend to stop chlorthalidone. Blood pressure log. Follow-up in one month with blood pressure log to see if we can decrease the dose of some of her antihypertensive medications.  Hyperlipidemia - patient on pravastatin. The labs being followed by PCP. Recommended LDL goal is less than 70 due to diabetes.  Obesity - Body mass index is 35.04 kg/(m^2).Marland Kitchen Recommend aggressive weight loss through diet and increased physical activity.   Current medicines are reviewed at length with the patient today.  The patient does not have concerns regarding medicines.  Labs/ tests  ordered today include:  No orders of the defined types were placed in this encounter.    I had a lengthy and detailed discussion with the patient regarding diagnoses, prognosis, diagnostic options, treatment options, and side effects of medications.   I counseled the patient on importance of lifestyle modification including heart healthy diet, regular physical activity.  Disposition:   FU with undersigned in one month.   Signed, Almond Lint, MD  07/06/2015 9:27 AM    [facility]

## 2015-07-13 ENCOUNTER — Emergency Department
Admission: EM | Admit: 2015-07-13 | Discharge: 2015-07-13 | Disposition: A | Discharge status: Home / Self Care | Payer: Medicare Other | Attending: Student | Admitting: Student

## 2015-07-13 ENCOUNTER — Encounter: Payer: Self-pay | Admitting: Emergency Medicine

## 2015-07-13 DIAGNOSIS — I10 Essential (primary) hypertension: Secondary | ICD-10-CM | POA: Diagnosis not present

## 2015-07-13 DIAGNOSIS — Z79899 Other long term (current) drug therapy: Secondary | ICD-10-CM | POA: Diagnosis not present

## 2015-07-13 DIAGNOSIS — Z7984 Long term (current) use of oral hypoglycemic drugs: Secondary | ICD-10-CM | POA: Insufficient documentation

## 2015-07-13 DIAGNOSIS — R4689 Other symptoms and signs involving appearance and behavior: Secondary | ICD-10-CM | POA: Diagnosis not present

## 2015-07-13 DIAGNOSIS — F79 Unspecified intellectual disabilities: Secondary | ICD-10-CM

## 2015-07-13 DIAGNOSIS — E7800 Pure hypercholesterolemia, unspecified: Secondary | ICD-10-CM | POA: Insufficient documentation

## 2015-07-13 DIAGNOSIS — F71 Moderate intellectual disabilities: Secondary | ICD-10-CM

## 2015-07-13 DIAGNOSIS — G809 Cerebral palsy, unspecified: Secondary | ICD-10-CM | POA: Diagnosis present

## 2015-07-13 DIAGNOSIS — E669 Obesity, unspecified: Secondary | ICD-10-CM | POA: Insufficient documentation

## 2015-07-13 DIAGNOSIS — Z794 Long term (current) use of insulin: Secondary | ICD-10-CM | POA: Insufficient documentation

## 2015-07-13 DIAGNOSIS — E119 Type 2 diabetes mellitus without complications: Secondary | ICD-10-CM | POA: Diagnosis not present

## 2015-07-13 DIAGNOSIS — E785 Hyperlipidemia, unspecified: Secondary | ICD-10-CM | POA: Diagnosis not present

## 2015-07-13 LAB — URINE DRUG SCREEN, QUALITATIVE (ARMC ONLY)
AMPHETAMINES, UR SCREEN: NOT DETECTED
Barbiturates, Ur Screen: NOT DETECTED
Benzodiazepine, Ur Scrn: NOT DETECTED
COCAINE METABOLITE, UR ~~LOC~~: NOT DETECTED
Cannabinoid 50 Ng, Ur ~~LOC~~: NOT DETECTED
MDMA (ECSTASY) UR SCREEN: NOT DETECTED
METHADONE SCREEN, URINE: NOT DETECTED
OPIATE, UR SCREEN: NOT DETECTED
Phencyclidine (PCP) Ur S: NOT DETECTED
TRICYCLIC, UR SCREEN: NOT DETECTED

## 2015-07-13 LAB — COMPREHENSIVE METABOLIC PANEL
ALK PHOS: 84 U/L (ref 38–126)
ALT: 18 U/L (ref 14–54)
AST: 19 U/L (ref 15–41)
Albumin: 4 g/dL (ref 3.5–5.0)
Anion gap: 6 (ref 5–15)
BILIRUBIN TOTAL: 0.5 mg/dL (ref 0.3–1.2)
BUN: 8 mg/dL (ref 6–20)
CALCIUM: 9 mg/dL (ref 8.9–10.3)
CHLORIDE: 101 mmol/L (ref 101–111)
CO2: 26 mmol/L (ref 22–32)
CREATININE: 0.57 mg/dL (ref 0.44–1.00)
Glucose, Bld: 182 mg/dL — ABNORMAL HIGH (ref 65–99)
Potassium: 3.6 mmol/L (ref 3.5–5.1)
Sodium: 133 mmol/L — ABNORMAL LOW (ref 135–145)
TOTAL PROTEIN: 7.9 g/dL (ref 6.5–8.1)

## 2015-07-13 LAB — URINALYSIS COMPLETE WITH MICROSCOPIC (ARMC ONLY)
BILIRUBIN URINE: NEGATIVE
Glucose, UA: 150 mg/dL — AB
HGB URINE DIPSTICK: NEGATIVE
Ketones, ur: NEGATIVE mg/dL
LEUKOCYTES UA: NEGATIVE
NITRITE: NEGATIVE
PH: 7 (ref 5.0–8.0)
PROTEIN: NEGATIVE mg/dL
SQUAMOUS EPITHELIAL / LPF: NONE SEEN
Specific Gravity, Urine: 1.002 — ABNORMAL LOW (ref 1.005–1.030)

## 2015-07-13 LAB — CBC WITH DIFFERENTIAL/PLATELET
BASOS ABS: 0 10*3/uL (ref 0–0.1)
BASOS PCT: 1 %
EOS ABS: 0 10*3/uL (ref 0–0.7)
EOS PCT: 0 %
HCT: 33.9 % — ABNORMAL LOW (ref 35.0–47.0)
Hemoglobin: 11.4 g/dL — ABNORMAL LOW (ref 12.0–16.0)
Lymphocytes Relative: 27 %
Lymphs Abs: 1.6 10*3/uL (ref 1.0–3.6)
MCH: 31.3 pg (ref 26.0–34.0)
MCHC: 33.6 g/dL (ref 32.0–36.0)
MCV: 93.1 fL (ref 80.0–100.0)
MONO ABS: 0.6 10*3/uL (ref 0.2–0.9)
Monocytes Relative: 10 %
Neutro Abs: 3.6 10*3/uL (ref 1.4–6.5)
Neutrophils Relative %: 62 %
Platelets: 234 10*3/uL (ref 150–440)
RBC: 3.64 MIL/uL — AB (ref 3.80–5.20)
RDW: 12.9 % (ref 11.5–14.5)
WBC: 5.8 10*3/uL (ref 3.6–11.0)

## 2015-07-13 LAB — ETHANOL

## 2015-07-13 NOTE — ED Notes (Addendum)
Pt comes into the ED via [employer] transportation.  Patient through a chair at a fellow staff member and has shown aggression to multiple people at her occupation.  Patient has decided to come in voluntary at the moment to speak with someone, but [employer] is currently getting IVC paperwork ready.  Officer explains that the patient has been talking perfectly fine and then will all of sudden lash out at people.  Patient present in NAD at this time, is calm, and willing to talk at this moment.  Patient lives in a group home associated with [facility].

## 2015-07-13 NOTE — ED Notes (Signed)
Pts group home called and reported that ride was on way
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
## 2015-07-13 NOTE — ED Notes (Signed)

## 2015-07-13 NOTE — ED Notes (Signed)
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
## 2015-07-13 NOTE — BH Assessment (Signed)
Patient has been seen by Psychiatrist, Dr. Clapacs and was cleared. Patient will be discharged. TTS Consult isn't needed at this time. Writer talked with ER MD, Dr. Gayle and Consult for TTS will be discontinued. 

## 2015-07-13 NOTE — ED Notes (Signed)
ENVIRONMENTAL ASSESSMENT Potentially harmful objects out of patient reach: YES Personal belongings secured: YES Patient dressed in hospital provided attire only: YES Plastic bags out of patient reach: YES Patient care equipment (cords, cables, call bells, lines, and drains) shortened, removed, or accounted for: YES Equipment and supplies removed from bottom of stretcher: YES Potentially toxic materials out of patient reach: YES Sharps container removed or out of patient reach: YESBEHAVIORAL HEALTH ROUNDING Patient sleeping: YES Patient alert and oriented: YES  Behavior appropriate: no Describe behavior: No inappropriate or unacceptable behaviors noted at this time.  Nutrition and fluids offered: YES Toileting and hygiene offered: YES Sitter present: Behavioral tech rounding every 15 minutes on patient to ensure safety.  Law enforcement present: YES Law enforcement agency: [employer] ([employer]) 

## 2015-07-13 NOTE — ED Notes (Signed)
Pts pick up here, papers given to group home workers.

## 2015-08-17 ENCOUNTER — Ambulatory Visit (INDEPENDENT_AMBULATORY_CARE_PROVIDER_SITE_OTHER): Payer: Medicare Other | Admitting: Cardiology

## 2015-08-17 ENCOUNTER — Encounter: Payer: Self-pay | Admitting: Cardiology

## 2015-08-17 VITALS — BP 128/72 | HR 71 | Ht 66.0 in | Wt 225.5 lb

## 2015-08-17 DIAGNOSIS — R002 Palpitations: Secondary | ICD-10-CM | POA: Diagnosis not present

## 2015-08-17 DIAGNOSIS — E669 Obesity, unspecified: Secondary | ICD-10-CM

## 2015-08-17 DIAGNOSIS — I10 Essential (primary) hypertension: Secondary | ICD-10-CM | POA: Diagnosis not present

## 2015-08-17 DIAGNOSIS — E785 Hyperlipidemia, unspecified: Secondary | ICD-10-CM

## 2015-08-17 NOTE — Progress Notes (Signed)
Cardiology Office Note   Date:  08/17/2015   ID:  Rachel [name], DOB 1960/10/09, MRN [id number]  Referring Doctor:  Rafael BihariWALKER III, JOHN B, MD   Cardiologist:   Almond LintAileen Renn Stille, MD   Reason for consultation:  Chief Complaint  Patient presents with  . other    1 month follow up. Meds reviewed by the [facility]. "doing well."       History of Present Illness: Rachel [name] is a 55 y.o. female who presents for Follow-up for HTN  BP measurements are higher at the facility vs the ones taken in office. We are not sure if they are measuring properly. They are using a wrist cuff. Also, pt admits to eating sodium laden food: chips, hotdogs, bacon, etc.  Again, no complaints of shortness of breath or chest pain.  In terms of hyperlipidemia, she continues to take pravastatin.  ROS:  Please see the history of present illness. Aside from mentioned under HPI, all other systems are reviewed and negative.     Past Medical History  Diagnosis Date  . Diabetes mellitus without complication (HCC)   . Hypertension   . Hypercholesteremia     Past Surgical History  Procedure Laterality Date  . Dilation and curettage of uterus       reports that she has never smoked. She has never used smokeless tobacco. She reports that she does not drink alcohol or use illicit drugs.   Family history is unknown by patient. family history unknown to patient due to her cerebral palsy.  Current Outpatient Prescriptions  Medication Sig Dispense Refill  . amLODipine (NORVASC) 10 MG tablet Take 10 mg by mouth daily.     . ARIPiprazole (ABILIFY) 10 MG tablet Take 10 mg by mouth daily.     . carbamazepine (TEGRETOL) 200 MG tablet Take 400 mg by mouth 2 (two) times daily.     . furosemide (LASIX) 40 MG tablet Take 40 mg by mouth daily as needed.     Marland Kitchen. glipiZIDE (GLUCOTROL XL) 10 MG 24 hr tablet Take 10 mg by mouth daily with breakfast.     . hydrALAZINE (APRESOLINE) 100  MG tablet Take 50 mg by mouth 3 (three) times daily.     . Insulin Glargine (LANTUS SOLOSTAR) 100 UNIT/ML Solostar Pen Inject 40 Units into the skin daily at 10 pm.     . loratadine (CLARITIN) 10 MG tablet Take 10 mg by mouth daily.     Marland Kitchen. losartan (COZAAR) 100 MG tablet Take 100 mg by mouth daily.     . magnesium oxide (MAG-OX) 400 MG tablet Take 400 mg by mouth daily.    . metFORMIN (GLUCOPHAGE) 1000 MG tablet Take 1,000 mg by mouth 2 (two) times daily with a meal.     . Multiple Vitamin (MULTIVITAMIN WITH MINERALS) TABS tablet Take 1 tablet by mouth daily.    . ondansetron (ZOFRAN) 4 MG tablet Take 4 mg by mouth 2 (two) times daily as needed for nausea or vomiting.    . pioglitazone (ACTOS) 30 MG tablet Take 30 mg by mouth daily.     . potassium chloride SA (K-DUR,KLOR-CON) 20 MEQ tablet Take 20 mEq by mouth 2 (two) times daily.     . pravastatin (PRAVACHOL) 80 MG tablet Take 80 mg by mouth daily.     . prazosin (MINIPRESS) 2 MG capsule Take 2 mg by mouth 2 (two) times daily.     .Marland Kitchen  ranitidine (ZANTAC) 150 MG tablet Take 150 mg by mouth 2 (two) times daily.    Marland Kitchen senna-docusate (SENOKOT-S) 8.6-50 MG tablet Take 1 tablet by mouth 2 (two) times daily.     No current facility-administered medications for this visit.    Allergies: Ace inhibitors    PHYSICAL EXAM: VS:  BP 128/72 mmHg  Pulse 71  Ht  (1.676 m)  Wt 225 lb 8 oz (102.286 kg)  BMI 36.41 kg/m2 , Body mass index is 36.41 kg/(m^2).   Wt Readings from Last 3 Encounters:  08/17/15 225 lb 8 oz (102.286 kg)  07/13/15 225 lb 5 oz (102.2 kg)  07/06/15 223 lb 12 oz (101.492 kg)    GENERAL:  well developed, well nourished,  obese, not in acute distress HEENT: normocephalic, pink conjunctivae, anicteric sclerae, no xanthelasma, normal dentition, oropharynx clear NECK:  no neck vein engorgement, JVP normal, no hepatojugular reflux, carotid upstroke brisk and symmetric, no bruit, no thyromegaly, no lymphadenopathy LUNGS:  good  respiratory effort, clear to auscultation bilaterally CV:  PMI not displaced, no thrills, no lifts, S1 and S2 within normal limits, no palpable S3 or S4, no rubs, no gallops, soft, 2 out of 6, systolic ejection murmur heard best at the base ABD:  Soft, nontender, nondistended, normoactive bowel sounds, no abdominal aortic bruit, no hepatomegaly, no splenomegaly MS: nontender back, no kyphosis, no scoliosis, no joint deformities EXT:  2+ DP/PT pulses, no edema, no varicosities, no cyanosis, no clubbing SKIN: warm, nondiaphoretic, normal turgor, no ulcers NEUROPSYCH: alert, oriented to person, place, and time, sensory/motor grossly intact, normal mood, appropriate affect  Recent Labs: 07/13/2015: ALT 18; BUN 8; Creatinine, Ser 0.57; Hemoglobin 11.4*; Platelets 234; Potassium 3.6; Sodium 133*   Lipid Panel No results found for: CHOL, TRIG, HDL, CHOLHDL, VLDL, LDLCALC, LDLDIRECT   Other studies Reviewed:  EKG:   The ekg from 06/01/2015 was personally reviewed by me and it revealed sinus rhythm, 84 BPM. Minimal voltage criteria for LVH.  Additional studies/ records that were reviewed personally reviewed by me today include:   Echocardiogram 06/27/2015: Left ventricle: The cavity size was normal. Systolic function was  normal. The estimated ejection fraction was in the range of 60%  to 65%. Wall motion was normal; there were no regional wall  motion abnormalities. Doppler parameters are consistent with  abnormal left ventricular relaxation (grade 1 diastolic  dysfunction). - Left atrium: The atrium was normal in size. - Right ventricle: Systolic function was normal. - Pulmonary arteries: Systolic pressure was within the normal  range. - Inferior vena cava: The vessel was normal in size. The  respirophasic diameter changes were in the normal range (>= 50%),  consistent with normal central venous pressure.  Holter 06/27/2015:  Sinus rhythm  Average of 84 BPM  20% of total  number of beats were sinus tachycardia  No high grade supraventricular or ventricular ectopy  Indication for study: Palpitations  Holter monitor from 06/27/2015 revealed overall rhythm to be sinus. Heart rate ranged from 57-156 BPM, average of 84 BPM. Maximum heart rate was at 9:39 AM. No symptom recorded. 20% of the total number of beats were sinus tachycardia. Maximum RR interval was normal at 1.4 seconds  In terms of supraventricular ectopy: Isolated PACs.  In terms of ventricular ectopy: 2 isolated PVCs.  No documented atrial fibrillation.  ASSESSMENT AND PLAN:  Palpitations  Holter from 06/27/2015 showed sinus rhythm, average of 84 BPM. Maximum heart rate was 156 BPM, no symptom recorded. No high grade  ventricular ectopy or supraventricular ectopy. No A. Fib. Echocardiogram from 06/27/2015 showed normal heart function. Overall findings were within normal limits. Patient has had no recurrence of palpitations. Sinus tachycardia may be compensatory to activity.  Hypertension - blood pressure today appears well-controlled.  Recommend to stop chlorthalidone. No further med blood pressure changes recommended. Recommend using arm cuff for BP monitor. Sodium restriction.   Hyperlipidemia - patient on pravastatin. The labs being followed by PCP. Recommended LDL goal is less than 70 due to diabetes.  Obesity - Body mass index is 35.04 kg/(m^2).Marland Kitchen Recommend aggressive weight loss through diet and increased physical activity.   Current medicines are reviewed at length with the patient today.  The patient does not have concerns regarding medicines.  Labs/ tests ordered today include:  Orders Placed This Encounter  Procedures  . EKG 12-Lead    I had a lengthy and detailed discussion with the patient regarding diagnoses, prognosis, diagnostic options, treatment options, and side effects of medications.   I counseled the patient on importance of lifestyle modification including heart  healthy diet, regular physical activity.  Disposition:   FU with undersigned prn  Signed, Almond Lint, MD  08/17/2015 10:14 AM    [facility]

## 2015-08-17 NOTE — Patient Instructions (Signed)
Medication Instructions:  Your physician recommends that you continue on your current medications as directed. Please refer to the Current Medication list given to you today.   Labwork: None ordered  Testing/Procedures: None ordered  Follow-Up: Your physician recommends that you schedule a follow-up appointment as needed. Follow up with your primary care physician for your blood pressure.

## 2015-09-12 DIAGNOSIS — E1142 Type 2 diabetes mellitus with diabetic polyneuropathy: Secondary | ICD-10-CM | POA: Insufficient documentation

## 2016-01-20 ENCOUNTER — Other Ambulatory Visit: Payer: Self-pay | Admitting: Internal Medicine

## 2016-01-20 DIAGNOSIS — Z1231 Encounter for screening mammogram for malignant neoplasm of breast: Secondary | ICD-10-CM

## 2016-02-27 ENCOUNTER — Ambulatory Visit: Payer: Medicare Other

## 2016-02-28 ENCOUNTER — Ambulatory Visit
Admission: RE | Admit: 2016-02-28 | Discharge: 2016-02-28 | Disposition: A | Discharge status: Home / Self Care | Payer: Medicare Other | Source: Ambulatory Visit | Attending: Internal Medicine | Admitting: Internal Medicine

## 2016-02-28 ENCOUNTER — Encounter: Payer: Self-pay | Admitting: Radiology

## 2016-02-28 DIAGNOSIS — Z1231 Encounter for screening mammogram for malignant neoplasm of breast: Secondary | ICD-10-CM | POA: Insufficient documentation

## 2017-02-11 ENCOUNTER — Other Ambulatory Visit: Payer: Self-pay | Admitting: Internal Medicine

## 2017-02-11 DIAGNOSIS — Z1231 Encounter for screening mammogram for malignant neoplasm of breast: Secondary | ICD-10-CM

## 2017-02-25 ENCOUNTER — Ambulatory Visit: Payer: Medicare Other | Admitting: Podiatry

## 2017-04-16 ENCOUNTER — Ambulatory Visit
Admission: RE | Admit: 2017-04-16 | Discharge: 2017-04-16 | Disposition: A | Discharge status: Home / Self Care | Payer: Medicare Other | Source: Ambulatory Visit | Attending: Internal Medicine | Admitting: Internal Medicine

## 2017-04-16 DIAGNOSIS — Z1231 Encounter for screening mammogram for malignant neoplasm of breast: Secondary | ICD-10-CM | POA: Diagnosis present

## 2017-05-09 ENCOUNTER — Encounter: Payer: Self-pay | Admitting: Podiatry

## 2017-05-09 ENCOUNTER — Ambulatory Visit (INDEPENDENT_AMBULATORY_CARE_PROVIDER_SITE_OTHER): Payer: Medicare Other | Admitting: Podiatry

## 2017-05-09 DIAGNOSIS — B351 Tinea unguium: Secondary | ICD-10-CM | POA: Diagnosis not present

## 2017-05-09 DIAGNOSIS — M79674 Pain in right toe(s): Secondary | ICD-10-CM | POA: Diagnosis not present

## 2017-05-09 DIAGNOSIS — E1142 Type 2 diabetes mellitus with diabetic polyneuropathy: Secondary | ICD-10-CM

## 2017-05-09 DIAGNOSIS — M79675 Pain in left toe(s): Secondary | ICD-10-CM | POA: Diagnosis not present

## 2017-05-09 NOTE — Progress Notes (Addendum)
Complaint:  Visit Type: Patient returns to my office for continued preventative foot care services. Complaint: Patient states" my nails have grown long and thick and become painful to walk and wear shoes" Patient has been diagnosed with DM with no foot complications. The patient presents for preventative foot care services. No changes to ROS.  Patient is diabetic on insulin.  Podiatric Exam: Vascular: dorsalis pedis and posterior tibial pulses are palpable bilateral. Capillary return is immediate. Temperature gradient is WNL. Skin turgor WNL  Sensorium: Normal Semmes Weinstein monofilament test. Normal tactile sensation bilaterally. Nail Exam: Pt has thick disfigured discolored nails with subungual debris noted 2,3  B/L.  All other nails are surgically removed. Ulcer Exam: There is no evidence of ulcer or pre-ulcerative changes or infection. Orthopedic Exam: Muscle tone and strength are WNL. No limitations in general ROM. No crepitus or effusions noted. Foot type and digits show no abnormalities. Bony prominences are unremarkable. Skin: No Porokeratosis. No infection or ulcers  Diagnosis:  Onychomycosis, , Pain in right toe, pain in left toes  Treatment & Plan Procedures and Treatment: Consent by patient was obtained for treatment procedures.   Debridement of mycotic and hypertrophic toenails, 1 through 5 bilateral and clearing of subungual debris. No ulceration, no infection noted. To consider diabetic shoes in future. Return Visit-Office Procedure: Patient instructed to return to the office for a follow up visit 3 months for continued evaluation and treatment.    Helane GuntherGregory Ondrea Dow DPM

## 2017-09-23 ENCOUNTER — Ambulatory Visit (INDEPENDENT_AMBULATORY_CARE_PROVIDER_SITE_OTHER): Payer: Medicare Other | Admitting: Podiatry

## 2017-09-23 ENCOUNTER — Encounter: Payer: Self-pay | Admitting: Podiatry

## 2017-09-23 DIAGNOSIS — M79674 Pain in right toe(s): Secondary | ICD-10-CM

## 2017-09-23 DIAGNOSIS — B351 Tinea unguium: Secondary | ICD-10-CM

## 2017-09-23 DIAGNOSIS — E1142 Type 2 diabetes mellitus with diabetic polyneuropathy: Secondary | ICD-10-CM

## 2017-09-23 DIAGNOSIS — M79675 Pain in left toe(s): Secondary | ICD-10-CM

## 2017-09-23 NOTE — Progress Notes (Signed)
Complaint:  Visit Type: Patient returns to my office for continued preventative foot care services. Complaint: Patient states" my nails have grown long and thick and become painful to walk and wear shoes" Patient has been diagnosed with DM with no foot complications. The patient presents for preventative foot care services. No changes to ROS.  Patient is diabetic on insulin.  Podiatric Exam: Vascular: dorsalis pedis and posterior tibial pulses are palpable bilateral. Capillary return is immediate. Temperature gradient is WNL. Skin turgor WNL  Sensorium: Normal Semmes Weinstein monofilament test. Normal tactile sensation bilaterally. Nail Exam: Pt has thick disfigured discolored nails with subungual debris noted 2,3  B/L.  All other nails are surgically removed. Ulcer Exam: There is no evidence of ulcer or pre-ulcerative changes or infection. Orthopedic Exam: Muscle tone and strength are WNL. No limitations in general ROM. No crepitus or effusions noted. Foot type and digits show no abnormalities. Bony prominences are unremarkable. Skin: No Porokeratosis. No infection or ulcers  Diagnosis:  Onychomycosis, , Pain in right toe, pain in left toes  Treatment & Plan Procedures and Treatment: Consent by patient was obtained for treatment procedures.   Debridement of mycotic and hypertrophic toenails, 1 through 5 bilateral and clearing of subungual debris. No ulceration, no infection noted.  Return Visit-Office Procedure: Patient instructed to return to the office for a follow up visit 3 months for continued evaluation and treatment.    Helane GuntherGregory Burnie Therien DPM

## 2017-10-14 ENCOUNTER — Encounter: Payer: Self-pay | Admitting: Podiatry

## 2017-10-14 ENCOUNTER — Ambulatory Visit (INDEPENDENT_AMBULATORY_CARE_PROVIDER_SITE_OTHER): Payer: Medicare Other | Admitting: Podiatry

## 2017-10-14 DIAGNOSIS — M205X2 Other deformities of toe(s) (acquired), left foot: Secondary | ICD-10-CM

## 2017-10-14 DIAGNOSIS — M205X1 Other deformities of toe(s) (acquired), right foot: Secondary | ICD-10-CM | POA: Diagnosis not present

## 2017-10-14 DIAGNOSIS — E1142 Type 2 diabetes mellitus with diabetic polyneuropathy: Secondary | ICD-10-CM

## 2017-10-14 DIAGNOSIS — M2022 Hallux rigidus, left foot: Secondary | ICD-10-CM

## 2017-10-14 NOTE — Progress Notes (Signed)
Complaint:  Visit Type: Patient returns to my office for an e of her feet.  She requests diabetic shoes with diagnosis peripheral nerve disease.valuation  Podiatric Exam: Vascular: dorsalis pedis and posterior tibial pulses are palpable bilateral. Capillary return is immediate. Temperature gradient is WNL. Skin turgor WNL  Sensorium: Normal Semmes Weinstein monofilament test. Normal tactile sensation bilaterally. Nail Exam: Pt has thick disfigured discolored nails with subungual debris noted 2,3  B/L.  All other nails are surgically removed. Ulcer Exam: There is no evidence of ulcer or pre-ulcerative changes or infection. Orthopedic Exam: Muscle tone and strength are WNL. No limitations in general ROM. No crepitus or effusions noted. Foot type and digits show no abnormalities. Hallux limitus 1st MPJ  B/L Skin: No Porokeratosis. 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  Patient does qualify for diabetic shoes.  Patient to make an appointment with Raiford Nobleick. Return Visit-Office Procedure: Patient instructed to return to the office for a follow up visit 2 months for continued evaluation and treatment.    Helane GuntherGregory Dennys Guin DPM

## 2017-10-23 ENCOUNTER — Ambulatory Visit: Payer: Medicare Other | Admitting: Orthotics

## 2017-10-23 DIAGNOSIS — M205X1 Other deformities of toe(s) (acquired), right foot: Secondary | ICD-10-CM

## 2017-10-23 DIAGNOSIS — M2022 Hallux rigidus, left foot: Secondary | ICD-10-CM

## 2017-10-23 DIAGNOSIS — E1142 Type 2 diabetes mellitus with diabetic polyneuropathy: Secondary | ICD-10-CM

## 2017-10-23 DIAGNOSIS — M205X2 Other deformities of toe(s) (acquired), left foot: Secondary | ICD-10-CM

## 2017-10-23 NOTE — Progress Notes (Signed)
Patient presents today for diabetic shoe measurement and foam casting.  Goals of diabetic shoes/inserts to offer protection from conditions secondary to DM2, offer relief from sheer forces that could lead to ulcerations, protect the foot, and offer greater stability. Patient is under supervision of DPM MayerPhysician managing patients DM2: johnston, John,Patient has following documented conditions to qualify for diabetic shoes/inserts: Patient measured with brannock device: 1M  Chose [id number]

## 2018-01-20 ENCOUNTER — Ambulatory Visit: Payer: Medicare Other | Admitting: Podiatry

## 2018-01-25 ENCOUNTER — Encounter: Payer: Self-pay | Admitting: *Deleted

## 2018-01-25 ENCOUNTER — Inpatient Hospital Stay
Admission: EM | Admit: 2018-01-25 | Discharge: 2018-01-27 | DRG: 392 | Disposition: A | Discharge status: Home / Self Care | Payer: Medicare Other | Attending: Internal Medicine | Admitting: Internal Medicine

## 2018-01-25 ENCOUNTER — Emergency Department: Payer: Medicare Other

## 2018-01-25 ENCOUNTER — Other Ambulatory Visit: Payer: Self-pay

## 2018-01-25 DIAGNOSIS — E785 Hyperlipidemia, unspecified: Secondary | ICD-10-CM | POA: Diagnosis present

## 2018-01-25 DIAGNOSIS — R55 Syncope and collapse: Secondary | ICD-10-CM

## 2018-01-25 DIAGNOSIS — F71 Moderate intellectual disabilities: Secondary | ICD-10-CM | POA: Diagnosis present

## 2018-01-25 DIAGNOSIS — E7800 Pure hypercholesterolemia, unspecified: Secondary | ICD-10-CM | POA: Diagnosis present

## 2018-01-25 DIAGNOSIS — Z888 Allergy status to other drugs, medicaments and biological substances status: Secondary | ICD-10-CM

## 2018-01-25 DIAGNOSIS — G809 Cerebral palsy, unspecified: Secondary | ICD-10-CM | POA: Diagnosis present

## 2018-01-25 DIAGNOSIS — E119 Type 2 diabetes mellitus without complications: Secondary | ICD-10-CM | POA: Diagnosis present

## 2018-01-25 DIAGNOSIS — R079 Chest pain, unspecified: Secondary | ICD-10-CM

## 2018-01-25 DIAGNOSIS — E871 Hypo-osmolality and hyponatremia: Secondary | ICD-10-CM | POA: Diagnosis present

## 2018-01-25 DIAGNOSIS — Z794 Long term (current) use of insulin: Secondary | ICD-10-CM

## 2018-01-25 DIAGNOSIS — R112 Nausea with vomiting, unspecified: Secondary | ICD-10-CM

## 2018-01-25 DIAGNOSIS — I10 Essential (primary) hypertension: Secondary | ICD-10-CM | POA: Diagnosis present

## 2018-01-25 DIAGNOSIS — R109 Unspecified abdominal pain: Secondary | ICD-10-CM | POA: Diagnosis not present

## 2018-01-25 DIAGNOSIS — E861 Hypovolemia: Secondary | ICD-10-CM | POA: Diagnosis present

## 2018-01-25 DIAGNOSIS — E876 Hypokalemia: Secondary | ICD-10-CM | POA: Diagnosis present

## 2018-01-25 DIAGNOSIS — K529 Noninfective gastroenteritis and colitis, unspecified: Principal | ICD-10-CM | POA: Diagnosis present

## 2018-01-25 HISTORY — DX: Cerebral palsy, unspecified: G80.9

## 2018-01-25 LAB — TROPONIN I

## 2018-01-25 LAB — COMPREHENSIVE METABOLIC PANEL
ALBUMIN: 3.9 g/dL (ref 3.5–5.0)
ALK PHOS: 95 U/L (ref 38–126)
ALT: 21 U/L (ref 0–44)
AST: 25 U/L (ref 15–41)
Anion gap: 13 (ref 5–15)
BUN: 7 mg/dL (ref 6–20)
CALCIUM: 9 mg/dL (ref 8.9–10.3)
CHLORIDE: 86 mmol/L — AB (ref 98–111)
CO2: 29 mmol/L (ref 22–32)
CREATININE: 0.84 mg/dL (ref 0.44–1.00)
GFR calc Af Amer: >60 mL/min (ref >60)
GFR calc non Af Amer: >60 mL/min (ref >60)
GLUCOSE: 228 mg/dL — AB (ref 70–99)
Potassium: 3.2 mmol/L — ABNORMAL LOW (ref 3.5–5.1)
Sodium: 128 mmol/L — ABNORMAL LOW (ref 135–145)
Total Bilirubin: 0.4 mg/dL (ref 0.3–1.2)
Total Protein: 7.1 g/dL (ref 6.5–8.1)

## 2018-01-25 LAB — CBC
HEMATOCRIT: 32.4 % — AB (ref 36.0–46.0)
HEMOGLOBIN: 10.9 g/dL — AB (ref 12.0–15.0)
MCH: 30.3 pg (ref 26.0–34.0)
MCHC: 33.6 g/dL (ref 30.0–36.0)
MCV: 90 fL (ref 80.0–100.0)
NRBC: 0 % (ref 0.0–0.2)
Platelets: 286 10*3/uL (ref 150–400)
RBC: 3.6 MIL/uL — AB (ref 3.87–5.11)
RDW: 12.6 % (ref 11.5–15.5)
WBC: 8 10*3/uL (ref 4.0–10.5)

## 2018-01-25 LAB — LIPASE, BLOOD: Lipase: 21 U/L (ref 11–51)

## 2018-01-25 MED ORDER — FENTANYL CITRATE (PF) 100 MCG/2ML IJ SOLN
50.0000 ug | Freq: Once | INTRAMUSCULAR | Status: AC
  Administered 2018-01-25: 50 ug via INTRAVENOUS
  Filled 2018-01-25: qty 2

## 2018-01-25 MED ORDER — ONDANSETRON HCL 4 MG/2ML IJ SOLN
4.0000 mg | Freq: Once | INTRAMUSCULAR | Status: AC
  Administered 2018-01-25: 4 mg via INTRAVENOUS
  Filled 2018-01-25: qty 2

## 2018-01-25 MED ORDER — SODIUM CHLORIDE 0.9 % IV BOLUS
1000.0000 mL | Freq: Once | INTRAVENOUS | Status: AC
  Administered 2018-01-25: 1000 mL via INTRAVENOUS

## 2018-01-25 MED ORDER — IOPAMIDOL (ISOVUE-370) INJECTION 76%
100.0000 mL | Freq: Once | INTRAVENOUS | Status: AC | PRN
  Administered 2018-01-25: 100 mL via INTRAVENOUS

## 2018-01-25 NOTE — ED Triage Notes (Signed)
Pt presents from [facility] home. Pt c/o chest and abdominal pain, is nauseated and has vomited x 1 PTA. Pt c/o neck pain, arm pain, neck, back and leg pain. Report from staff is that pt felt light headed PTA, pt fell to ground at group home c/o dizziness.

## 2018-01-25 NOTE — ED Notes (Signed)
Pt to CT

## 2018-01-25 NOTE — ED Notes (Signed)
IV team in to look for IV access.

## 2018-01-25 NOTE — ED Provider Notes (Signed)
[facility] Emergency Department Provider Note  ____________________________________________  Time seen: Approximately 9:38 PM  I have reviewed the triage vital signs and the nursing notes.   HISTORY  Chief Complaint Chest Pain and Emesis  Level 5 caveat:  Portions of the history and physical were unable to be obtained due to cerebral palsy and mental retardation   HPI Rachel [name] is a 57 y.o. female with a history of cerebral palsy, moderate mental retardation, diabetes, hypertension, hyperlipidemia who presents for evaluation of chest pain abdominal pain.  According to the caretaker patient was in her usual state of health until this evening.  She was walking outside when she started feeling nauseous and had one episode of vomiting.  She then started complaining of chest pain, back pain, abdominal pain.  She had a near syncopal event while trying to walk back into the house.  No fall or trauma sustained during this episode.  No full LOC.  Patient is unable to provide details to history other than complaining of sore throat, neck pain, back pain, abdominal pain, and chest pain. Unable to provide any further details.   Past Medical History:  Diagnosis Date  . Cerebral palsy (HCC)   . Diabetes mellitus without complication (HCC)   . Hypercholesteremia   . Hypertension     Patient Active Problem List   Diagnosis Date Noted  . 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 Aggression 07/13/2015  . Palpitations 07/06/2015  . Essential hypertension, benign 07/06/2015  . Obesity 07/06/2015  . Cerebral palsy (HCC) 12/24/2013  . Controlled type 2 diabetes mellitus without complication (HCC) 12/24/2013  . HLD (hyperlipidemia) 12/24/2013  . BP (high blood pressure) 12/24/2013  . Adiposity 12/24/2013  . Peripheral nerve disease 12/24/2013    Past Surgical History:  Procedure Laterality Date  . DILATION AND CURETTAGE OF UTERUS      Prior to Admission  medications   Medication Sig Start Date End Date Taking? Authorizing Provider  amLODipine (NORVASC) 10 MG tablet Take 10 mg by mouth daily.     [provider]  ARIPiprazole (ABILIFY) 10 MG tablet Take 10 mg by mouth daily.  04/26/14   [provider]  carbamazepine (TEGRETOL) 200 MG tablet Take 400 mg by mouth 2 (two) times daily.     [provider]  chlorthalidone (HYGROTON) 25 MG tablet TAKE 1 TABLET BY MOUTH EVERY OTHER DAY. 10/02/17   [provider]  fluticasone (FLONASE) 50 MCG/ACT nasal spray Place into the nose. 02/25/17 02/25/18  [provider]  furosemide (LASIX) 40 MG tablet Take 40 mg by mouth daily as needed.     [provider]  glipiZIDE (GLUCOTROL XL) 10 MG 24 hr tablet Take 10 mg by mouth daily with breakfast.  09/20/14   [provider]  hydrALAZINE (APRESOLINE) 100 MG tablet Take 50 mg by mouth 3 (three) times daily.  07/20/15   [provider]  Insulin Glargine (LANTUS SOLOSTAR) 100 UNIT/ML Solostar Pen Inject 40 Units into the skin daily at 10 pm.  08/01/15   [provider]  loratadine (CLARITIN) 10 MG tablet Take 10 mg by mouth daily.  09/21/14   [provider]  losartan (COZAAR) 100 MG tablet Take 100 mg by mouth daily.  01/04/15 01/04/16  [provider]  magnesium oxide (MAG-OX) 400 MG tablet Take 400 mg by mouth daily.    [provider]  medroxyPROGESTERone (DEPO-PROVERA) 150 MG/ML injection Inject into the muscle. 03/05/17   [provider]  metFORMIN (GLUCOPHAGE) 1000 MG tablet Take 1,000 mg by mouth 2 (two) times daily with a meal.  02/21/15   [provider]  Multiple Vitamin (MULTIVITAMIN WITH MINERALS) TABS tablet Take 1 tablet by mouth daily.    [provider]  nystatin cream (MYCOSTATIN) Apply topically.    [provider]  ondansetron (ZOFRAN) 4 MG tablet Take 4 mg by mouth 2 (two) times daily as needed for nausea or vomiting.     [provider]  pioglitazone (ACTOS) 30 MG tablet Take 30 mg by mouth daily.  09/20/14   [provider]  potassium chloride SA (K-DUR,KLOR-CON) 20 MEQ tablet Take 20 mEq by mouth 2 (two) times daily.  04/13/14   [provider]  pravastatin (PRAVACHOL) 80 MG tablet Take 80 mg by mouth daily.  07/20/15   [provider]  prazosin (MINIPRESS) 2 MG capsule Take 2 mg by mouth 2 (two) times daily.  09/20/14   [provider]  ranitidine (ZANTAC) 150 MG tablet Take 150 mg by mouth 2 (two) times daily.    [provider]  senna-docusate (SENOKOT-S) 8.6-50 MG tablet Take 1 tablet by mouth 2 (two) times daily.    [provider]  triamcinolone cream (KENALOG) 0.1 % Apply topically.    [provider]    Allergies Ace inhibitors  Family History  Family history unknown: Yes    Social History Social History   Tobacco Use  . Smoking status: Never Smoker  . Smokeless tobacco: Never Used  Substance Use Topics  . Alcohol use: No    Alcohol/week: 0.0 standard drinks  . Drug use: No    Review of Systems  Constitutional: Negative for fever. + near syncope ENT: Negative for sore throat. Neck: + neck pain  Cardiovascular: + chest pain. Respiratory: Negative for shortness of breath. Gastrointestinal: + abdominal pain, nausea, and vomiting. No diarrhea. Genitourinary: Negative for dysuria. Musculoskeletal: + back pain. Skin: Negative for rash. Neurological: Negative for headaches, weakness or numbness. Psych: No SI or HI  ____________________________________________   PHYSICAL EXAM:  VITAL SIGNS: ED Triage Vitals [01/25/18 2032]  Enc Vitals Group     BP (!) 107/49     Pulse Rate 92     Resp 14     Temp 97.6 F (36.4 C)     Temp Source Oral     SpO2 100 %     Weight      Height      Head Circumference      Peak Flow      Pain Score      Pain Loc      Pain Edu?      Excl. in GC?     Constitutional:  Alert and oriented to self, no distress.  HEENT:      Head: Normocephalic and atraumatic.         Eyes: Conjunctivae are normal. Sclera is non-icteric.       Mouth/Throat: Mucous membranes are moist.  Pharynx is clear      Neck: Supple with no signs of meningismus.  No tenderness palpation Cardiovascular: Regular rate and rhythm. No murmurs, gallops, or rubs. 2+ symmetrical distal pulses are present in all extremities. No JVD. Respiratory: Normal respiratory effort. Lungs are clear to auscultation bilaterally. No wheezes, crackles, or rhonchi.  Gastrointestinal: Soft, non tender, and non distended with positive bowel sounds. No rebound or guarding. Musculoskeletal: Nontender with normal range of motion in all extremities. No  edema, cyanosis, or erythema of extremities. Neurologic: Face is symmetric. Moving all extremities. No gross focal neurologic deficits are appreciated. Skin: Skin is warm, dry and intact. No rash noted. Psychiatric: Mood and affect are normal. Speech and behavior are normal.  ____________________________________________   LABS (all labs ordered are listed, but only abnormal results are displayed)  Labs Reviewed  CBC - Abnormal; Notable for the following components:      Result Value   RBC 3.60 (*)    Hemoglobin 10.9 (*)    HCT 32.4 (*)    All other components within normal limits  COMPREHENSIVE METABOLIC PANEL - Abnormal; Notable for the following components:   Sodium 128 (*)    Potassium 3.2 (*)    Chloride 86 (*)    Glucose, Bld 228 (*)    All other components within normal limits  TROPONIN I  LIPASE, BLOOD  CBG MONITORING, ED   ____________________________________________  EKG  ED ECG REPORT I, Nita Sickle, the attending physician, personally viewed and interpreted this ECG.  Normal sinus rhythm, rate of 92, normal intervals, normal axis, no ST elevations or depressions.  No changes when compared to prior from  2017 ____________________________________________  RADIOLOGY  I have personally reviewed the images performed during this visit and I agree with the Radiologist's read.   Interpretation by Radiologist:  Dg Chest Port 1 View  Result Date: 01/25/2018 CLINICAL DATA:  Chest pain EXAM: PORTABLE CHEST 1 VIEW COMPARISON:  05/27/2015 FINDINGS: Unchanged moderate cardiomegaly. No focal airspace consolidation or pulmonary edema. No pleural effusion or pneumothorax. IMPRESSION: Cardiomegaly without pulmonary edema. Electronically Signed   By: Deatra Robinson M.D.   On: 01/25/2018 21:52      ____________________________________________   PROCEDURES  Procedure(s) performed: None Procedures Critical Care performed:  None ____________________________________________   INITIAL IMPRESSION / ASSESSMENT AND PLAN / ED COURSE  57 y.o. female with a history of cerebral palsy, moderate mental retardation, diabetes, hypertension, hyperlipidemia who presents for evaluation of chest pain, abdominal pain, neck pain, sore throat, and back pain in the setting of nausea, vomiting, and near syncopal event.  Patient does not seem to be in any distress, her vitals are within normal limits, her exam is nonfocal with no abdominal tenderness, clear lungs, regular rate and rhythm with no murmurs, and no tenderness palpation of the back.  We will do a CT angiogram of chest abdomen pelvis to rule out dissection, will check labs including CBC, CMP, lipase, urinalysis, troponin.  EKG with no evidence of ischemia.    _________________________ 11:54 PM on 01/25/2018 ----------------------------------------- Labs showing mild hyponatremia for which patient was given IV fluids.  Hyperglycemia no evidence of DKA.  First troponin is negative. CT c/a/p with no acute findings. Will admit patient for syncope/ chest pain work up.   As part of my medical decision making, I reviewed the following data within the electronic medical  record:  Nursing notes reviewed and incorporated, Labs reviewed , EKG interpreted , Old EKG reviewed, Old chart reviewed, Radiograph reviewed , Discussed with admitting physician , Notes from prior ED visits and [place] Controlled Substance Database    Pertinent labs & imaging results that were available during my care of the patient were reviewed by me and considered in my medical decision making (see chart for details).    ____________________________________________   FINAL CLINICAL IMPRESSION(S) / ED DIAGNOSES  Final diagnoses:  Syncope, unspecified syncope type  Non-intractable vomiting with nausea, unspecified vomiting type  Chest pain, unspecified type  Abdominal  pain, unspecified abdominal location      NEW MEDICATIONS STARTED DURING THIS VISIT:  ED Discharge Orders    None       Note:  This document was prepared using Dragon voice recognition software and may include unintentional dictation errors.    Nita Sickle, MD 01/25/18 [phone]

## 2018-01-25 NOTE — ED Notes (Signed)
Pt returned from CT °

## 2018-01-26 DIAGNOSIS — E119 Type 2 diabetes mellitus without complications: Secondary | ICD-10-CM | POA: Diagnosis present

## 2018-01-26 DIAGNOSIS — R109 Unspecified abdominal pain: Secondary | ICD-10-CM | POA: Diagnosis present

## 2018-01-26 DIAGNOSIS — E876 Hypokalemia: Secondary | ICD-10-CM | POA: Diagnosis present

## 2018-01-26 DIAGNOSIS — E785 Hyperlipidemia, unspecified: Secondary | ICD-10-CM | POA: Diagnosis present

## 2018-01-26 DIAGNOSIS — F71 Moderate intellectual disabilities: Secondary | ICD-10-CM | POA: Diagnosis present

## 2018-01-26 DIAGNOSIS — E871 Hypo-osmolality and hyponatremia: Secondary | ICD-10-CM | POA: Diagnosis present

## 2018-01-26 DIAGNOSIS — G809 Cerebral palsy, unspecified: Secondary | ICD-10-CM | POA: Diagnosis present

## 2018-01-26 DIAGNOSIS — K529 Noninfective gastroenteritis and colitis, unspecified: Secondary | ICD-10-CM | POA: Diagnosis present

## 2018-01-26 DIAGNOSIS — E7800 Pure hypercholesterolemia, unspecified: Secondary | ICD-10-CM | POA: Diagnosis present

## 2018-01-26 DIAGNOSIS — Z794 Long term (current) use of insulin: Secondary | ICD-10-CM | POA: Diagnosis not present

## 2018-01-26 DIAGNOSIS — E861 Hypovolemia: Secondary | ICD-10-CM | POA: Diagnosis present

## 2018-01-26 DIAGNOSIS — Z888 Allergy status to other drugs, medicaments and biological substances status: Secondary | ICD-10-CM | POA: Diagnosis not present

## 2018-01-26 DIAGNOSIS — I10 Essential (primary) hypertension: Secondary | ICD-10-CM | POA: Diagnosis present

## 2018-01-26 LAB — URINALYSIS, ROUTINE W REFLEX MICROSCOPIC
BACTERIA UA: NONE SEEN
Bilirubin Urine: NEGATIVE
Glucose, UA: NEGATIVE mg/dL
Hgb urine dipstick: NEGATIVE
Ketones, ur: NEGATIVE mg/dL
Leukocytes, UA: NEGATIVE
Nitrite: NEGATIVE
PROTEIN: NEGATIVE mg/dL
Specific Gravity, Urine: 1.005 (ref 1.005–1.030)
pH: 8 (ref 5.0–8.0)

## 2018-01-26 LAB — BASIC METABOLIC PANEL
Anion gap: 12 (ref 5–15)
BUN: 6 mg/dL (ref 6–20)
CALCIUM: 8.9 mg/dL (ref 8.9–10.3)
CO2: 28 mmol/L (ref 22–32)
CREATININE: 0.75 mg/dL (ref 0.44–1.00)
Chloride: 90 mmol/L — ABNORMAL LOW (ref 98–111)
GFR calc non Af Amer: >60 mL/min (ref >60)
Glucose, Bld: 209 mg/dL — ABNORMAL HIGH (ref 70–99)
Potassium: 3.7 mmol/L (ref 3.5–5.1)
Sodium: 130 mmol/L — ABNORMAL LOW (ref 135–145)

## 2018-01-26 LAB — CBC
HEMATOCRIT: 31.6 % — AB (ref 36.0–46.0)
Hemoglobin: 10.5 g/dL — ABNORMAL LOW (ref 12.0–15.0)
MCH: 29.9 pg (ref 26.0–34.0)
MCHC: 33.2 g/dL (ref 30.0–36.0)
MCV: 90 fL (ref 80.0–100.0)
PLATELETS: 285 10*3/uL (ref 150–400)
RBC: 3.51 MIL/uL — ABNORMAL LOW (ref 3.87–5.11)
RDW: 12.8 % (ref 11.5–15.5)
WBC: 7.3 10*3/uL (ref 4.0–10.5)
nRBC: 0 % (ref 0.0–0.2)

## 2018-01-26 LAB — GLUCOSE, CAPILLARY
GLUCOSE-CAPILLARY: 221 mg/dL — AB (ref 70–99)
GLUCOSE-CAPILLARY: 184 mg/dL — AB (ref 70–99)
GLUCOSE-CAPILLARY: 205 mg/dL — AB (ref 70–99)
Glucose-Capillary: 127 mg/dL — ABNORMAL HIGH (ref 70–99)
Glucose-Capillary: 173 mg/dL — ABNORMAL HIGH (ref 70–99)

## 2018-01-26 LAB — TROPONIN I: Troponin I: <0.03 ng/mL (ref <0.03)

## 2018-01-26 MED ORDER — INSULIN ASPART 100 UNIT/ML ~~LOC~~ SOLN: 0.0000 [IU] | Freq: Every day | SUBCUTANEOUS | Status: DC

## 2018-01-26 MED ORDER — ONDANSETRON HCL 4 MG/2ML IJ SOLN: 4.0000 mg | Freq: Four times a day (QID) | INTRAMUSCULAR | Status: DC | PRN

## 2018-01-26 MED ORDER — INSULIN GLARGINE 100 UNIT/ML ~~LOC~~ SOLN
20.0000 [IU] | Freq: Every day | SUBCUTANEOUS | Status: DC
  Administered 2018-01-26 – 2018-01-27 (×2): 20 [IU] via SUBCUTANEOUS
  Filled 2018-01-26 (×3): qty 0.2

## 2018-01-26 MED ORDER — HYDROCODONE-ACETAMINOPHEN 5-325 MG PO TABS
1.0000 | ORAL_TABLET | ORAL | Status: DC | PRN
  Administered 2018-01-27: 2 via ORAL
  Filled 2018-01-26: qty 2

## 2018-01-26 MED ORDER — CARBAMAZEPINE 200 MG PO TABS
400.0000 mg | ORAL_TABLET | Freq: Two times a day (BID) | ORAL | Status: DC
  Administered 2018-01-26 – 2018-01-27 (×3): 400 mg via ORAL
  Filled 2018-01-26 (×5): qty 2

## 2018-01-26 MED ORDER — FAMOTIDINE 20 MG PO TABS
20.0000 mg | ORAL_TABLET | Freq: Every day | ORAL | Status: DC
  Administered 2018-01-26 – 2018-01-27 (×2): 20 mg via ORAL
  Filled 2018-01-26 (×2): qty 1

## 2018-01-26 MED ORDER — PRAZOSIN HCL 2 MG PO CAPS
2.0000 mg | ORAL_CAPSULE | Freq: Two times a day (BID) | ORAL | Status: DC
  Administered 2018-01-26 – 2018-01-27 (×3): 2 mg via ORAL
  Filled 2018-01-26 (×4): qty 1

## 2018-01-26 MED ORDER — SODIUM CHLORIDE 0.9 % IV SOLN
INTRAVENOUS | Status: DC
  Administered 2018-01-26 – 2018-01-27 (×2): via INTRAVENOUS

## 2018-01-26 MED ORDER — INSULIN ASPART 100 UNIT/ML ~~LOC~~ SOLN
0.0000 [IU] | Freq: Three times a day (TID) | SUBCUTANEOUS | Status: DC
  Administered 2018-01-26: 2 [IU] via SUBCUTANEOUS
  Administered 2018-01-26: 3 [IU] via SUBCUTANEOUS
  Administered 2018-01-27: 2 [IU] via SUBCUTANEOUS
  Administered 2018-01-27: 1 [IU] via SUBCUTANEOUS
  Filled 2018-01-26 (×4): qty 1

## 2018-01-26 MED ORDER — ADULT MULTIVITAMIN W/MINERALS CH
1.0000 | ORAL_TABLET | Freq: Every day | ORAL | Status: DC
  Administered 2018-01-26 – 2018-01-27 (×2): 1 via ORAL
  Filled 2018-01-26 (×2): qty 1

## 2018-01-26 MED ORDER — HEPARIN SODIUM (PORCINE) 5000 UNIT/ML IJ SOLN
5000.0000 [IU] | Freq: Three times a day (TID) | INTRAMUSCULAR | Status: DC
  Administered 2018-01-26 – 2018-01-27 (×4): 5000 [IU] via SUBCUTANEOUS
  Filled 2018-01-26 (×4): qty 1

## 2018-01-26 MED ORDER — ARIPIPRAZOLE 10 MG PO TABS
10.0000 mg | ORAL_TABLET | Freq: Every day | ORAL | Status: DC
  Administered 2018-01-26 – 2018-01-27 (×2): 10 mg via ORAL
  Filled 2018-01-26 (×2): qty 1

## 2018-01-26 MED ORDER — ACETAMINOPHEN 325 MG PO TABS
650.0000 mg | ORAL_TABLET | Freq: Four times a day (QID) | ORAL | Status: DC | PRN
  Administered 2018-01-26: 650 mg via ORAL
  Filled 2018-01-26: qty 2

## 2018-01-26 MED ORDER — ONDANSETRON HCL 4 MG PO TABS: 4.0000 mg | ORAL_TABLET | Freq: Four times a day (QID) | ORAL | Status: DC | PRN

## 2018-01-26 MED ORDER — POTASSIUM CHLORIDE CRYS ER 20 MEQ PO TBCR
20.0000 meq | EXTENDED_RELEASE_TABLET | Freq: Two times a day (BID) | ORAL | Status: DC
  Administered 2018-01-26 – 2018-01-27 (×3): 20 meq via ORAL
  Filled 2018-01-26 (×3): qty 1

## 2018-01-26 MED ORDER — SENNOSIDES-DOCUSATE SODIUM 8.6-50 MG PO TABS
1.0000 | ORAL_TABLET | Freq: Two times a day (BID) | ORAL | Status: DC
  Administered 2018-01-26 – 2018-01-27 (×3): 1 via ORAL
  Filled 2018-01-26 (×3): qty 1

## 2018-01-26 MED ORDER — PRAVASTATIN SODIUM 20 MG PO TABS
80.0000 mg | ORAL_TABLET | Freq: Every day | ORAL | Status: DC
  Administered 2018-01-26: 80 mg via ORAL
  Filled 2018-01-26: qty 4

## 2018-01-26 MED ORDER — LORATADINE 10 MG PO TABS
10.0000 mg | ORAL_TABLET | Freq: Every day | ORAL | Status: DC
  Administered 2018-01-26 – 2018-01-27 (×2): 10 mg via ORAL
  Filled 2018-01-26 (×2): qty 1

## 2018-01-26 MED ORDER — DOCUSATE SODIUM 100 MG PO CAPS
100.0000 mg | ORAL_CAPSULE | Freq: Two times a day (BID) | ORAL | Status: DC
  Administered 2018-01-26 – 2018-01-27 (×3): 100 mg via ORAL
  Filled 2018-01-26 (×3): qty 1

## 2018-01-26 MED ORDER — LOSARTAN POTASSIUM 50 MG PO TABS
100.0000 mg | ORAL_TABLET | Freq: Every day | ORAL | Status: DC
  Administered 2018-01-26 – 2018-01-27 (×2): 100 mg via ORAL
  Filled 2018-01-26 (×2): qty 2

## 2018-01-26 MED ORDER — MAGNESIUM OXIDE 400 (241.3 MG) MG PO TABS
400.0000 mg | ORAL_TABLET | Freq: Every day | ORAL | Status: DC
  Administered 2018-01-26 – 2018-01-27 (×2): 400 mg via ORAL
  Filled 2018-01-26 (×2): qty 1

## 2018-01-26 MED ORDER — ONDANSETRON HCL 4 MG PO TABS: 4.0000 mg | ORAL_TABLET | Freq: Two times a day (BID) | ORAL | Status: DC | PRN

## 2018-01-26 MED ORDER — ACETAMINOPHEN 650 MG RE SUPP: 650.0000 mg | Freq: Four times a day (QID) | RECTAL | Status: DC | PRN

## 2018-01-26 MED ORDER — TRAZODONE HCL 50 MG PO TABS: 25.0000 mg | ORAL_TABLET | Freq: Every evening | ORAL | Status: DC | PRN

## 2018-01-26 MED ORDER — FLUTICASONE PROPIONATE 50 MCG/ACT NA SUSP
1.0000 | Freq: Every day | NASAL | Status: DC
  Administered 2018-01-26 – 2018-01-27 (×2): 1 via NASAL
  Filled 2018-01-26 (×2): qty 16

## 2018-01-26 MED ORDER — BISACODYL 5 MG PO TBEC: 5.0000 mg | DELAYED_RELEASE_TABLET | Freq: Every day | ORAL | Status: DC | PRN

## 2018-01-26 NOTE — H&P (Signed)
[facility] [place] at [facility]   PATIENT NAME: Rachel [name]    MR#:  [id number]  DATE OF BIRTH:  1960-08-28  DATE OF ADMISSION:  01/25/2018  PRIMARY CARE PHYSICIAN: Gracelyn Nurse, MD   REQUESTING/REFERRING PHYSICIAN:   CHIEF COMPLAINT:   Chief Complaint  Patient presents with  . Chest Pain  . Emesis    HISTORY OF PRESENT ILLNESS: Rachel [name]  is a 57 y.o. female with a known history of cerebral palsy and mental retardation.  She lives in a group home. She is unable to provide reliable history due to her mental retardation and currently not feeling well.  Most of the information was taken from reviewing the medical chart and from discussion with emergency room physician. Patient was brought to emergency room for chest pain and abdominal pain, started suddenly just few hours before presenting to emergency room. According to the caretaker, patient was in her usual state of health until this evening. She was walking outside when she started feeling nauseous and had one episode of vomiting.  She then started complaining of chest pain, back pain, abdominal pain.  She had a near syncopal event while trying to walk back into the house.  No fall or trauma sustained during this episode.  No full LOC.  No new medications. No reports of fever, chills, bleeding, diarrhea. Blood test done emergency room are notable for sodium level of 128, potassium 3.2, glucose 228, hemoglobin 10.9.  Troponin level is less than 0.03. EKG shows normal sinus rhythm with heart rate 72 bpm, no acute ischemic changes. No acute changes per CT of the chest and abdomen. Patient is admitted for further evaluation and treatment.  PAST MEDICAL HISTORY:   Past Medical History:  Diagnosis Date  . Cerebral palsy (HCC)   . Diabetes mellitus without complication (HCC)   . Hypercholesteremia   . Hypertension     PAST SURGICAL HISTORY:  Past Surgical History:  Procedure Laterality Date  .  DILATION AND CURETTAGE OF UTERUS      SOCIAL HISTORY:  Social History   Tobacco Use  . Smoking status: Never Smoker  . Smokeless tobacco: Never Used  Substance Use Topics  . Alcohol use: No    Alcohol/week: 0.0 standard drinks    FAMILY HISTORY:  Family History  Family history unknown: Yes    DRUG ALLERGIES:  Allergies  Allergen Reactions  . Ace Inhibitors Swelling    REVIEW OF SYSTEMS:   Unable to obtain due to patient's mental retardation.  MEDICATIONS AT HOME:  Prior to Admission medications   Medication Sig Start Date End Date Taking? Authorizing Provider  amLODipine (NORVASC) 10 MG tablet Take 10 mg by mouth daily.     [provider]  ARIPiprazole (ABILIFY) 10 MG tablet Take 10 mg by mouth daily.  04/26/14   [provider]  carbamazepine (TEGRETOL) 200 MG tablet Take 400 mg by mouth 2 (two) times daily.     [provider]  chlorthalidone (HYGROTON) 25 MG tablet TAKE 1 TABLET BY MOUTH EVERY OTHER DAY. 10/02/17   [provider]  fluticasone (FLONASE) 50 MCG/ACT nasal spray Place into the nose. 02/25/17 02/25/18  [provider]  furosemide (LASIX) 40 MG tablet Take 40 mg by mouth daily as needed.     [provider]  glipiZIDE (GLUCOTROL XL) 10 MG 24 hr tablet Take 10 mg by mouth daily with breakfast.  09/20/14   [provider]  hydrALAZINE (APRESOLINE) 100  MG tablet Take 50 mg by mouth 3 (three) times daily.  07/20/15   [provider]  Insulin Glargine (LANTUS SOLOSTAR) 100 UNIT/ML Solostar Pen Inject 40 Units into the skin daily at 10 pm.  08/01/15   [provider]  loratadine (CLARITIN) 10 MG tablet Take 10 mg by mouth daily.  09/21/14   [provider]  losartan (COZAAR) 100 MG tablet Take 100 mg by mouth daily.  01/04/15 01/04/16  [provider]  magnesium oxide (MAG-OX) 400 MG tablet Take 400 mg by mouth daily.    [provider]  medroxyPROGESTERone  (DEPO-PROVERA) 150 MG/ML injection Inject into the muscle. 03/05/17   [provider]  metFORMIN (GLUCOPHAGE) 1000 MG tablet Take 1,000 mg by mouth 2 (two) times daily with a meal.  02/21/15   [provider]  Multiple Vitamin (MULTIVITAMIN WITH MINERALS) TABS tablet Take 1 tablet by mouth daily.    [provider]  nystatin cream (MYCOSTATIN) Apply topically.    [provider]  ondansetron (ZOFRAN) 4 MG tablet Take 4 mg by mouth 2 (two) times daily as needed for nausea or vomiting.    [provider]  pioglitazone (ACTOS) 30 MG tablet Take 30 mg by mouth daily.  09/20/14   [provider]  potassium chloride SA (K-DUR,KLOR-CON) 20 MEQ tablet Take 20 mEq by mouth 2 (two) times daily.  04/13/14   [provider]  pravastatin (PRAVACHOL) 80 MG tablet Take 80 mg by mouth daily.  07/20/15   [provider]  prazosin (MINIPRESS) 2 MG capsule Take 2 mg by mouth 2 (two) times daily.  09/20/14   [provider]  ranitidine (ZANTAC) 150 MG tablet Take 150 mg by mouth 2 (two) times daily.    [provider]  senna-docusate (SENOKOT-S) 8.6-50 MG tablet Take 1 tablet by mouth 2 (two) times daily.    [provider]  triamcinolone cream (KENALOG) 0.1 % Apply topically.    [provider]      PHYSICAL EXAMINATION:   VITAL SIGNS: Blood pressure (!) 167/72, pulse (!) 106, temperature 97.6 F (36.4 C), temperature source Oral, resp. rate 20, SpO2 99 %.  GENERAL:  57 y.o.-year-old patient lying in the bed with no acute distress.  EYES: Pupils equal, round, reactive to light and accommodation. No scleral icterus. Extraocular muscles intact.  HEENT: Head atraumatic, normocephalic. Oropharynx and nasopharynx clear.  NECK:  Supple, no jugular venous distention. No thyroid enlargement, no tenderness.  LUNGS: Normal breath sounds bilaterally, no wheezing, rales,rhonchi or crepitation. No use of accessory muscles  of respiration.  CARDIOVASCULAR: S1, S2 normal. No S3/S4.  ABDOMEN: Soft, nontender, nondistended. Bowel sounds present. No organomegaly or mass.  EXTREMITIES: No pedal edema, cyanosis, or clubbing.  NEUROLOGIC: Cranial nerves II through XII are intact. Muscle strength 5/5 in all extremities. Sensation intact.   PSYCHIATRIC: The patient is alert, but confused.  SKIN: No obvious rash, lesion, or ulcer.   LABORATORY PANEL:   CBC Recent Labs  Lab 01/25/18 2210  WBC 8.0  HGB 10.9*  HCT 32.4*  PLT 286  MCV 90.0  MCH 30.3  MCHC 33.6  RDW 12.6   ------------------------------------------------------------------------------------------------------------------  Chemistries  Recent Labs  Lab 01/25/18 2210  NA 128*  K 3.2*  CL 86*  CO2 29  GLUCOSE 228*  BUN 7  CREATININE 0.84  CALCIUM 9.0  AST 25  ALT 21  ALKPHOS 95  BILITOT 0.4   ------------------------------------------------------------------------------------------------------------------ CrCl cannot be calculated (Unknown ideal  weight.). ------------------------------------------------------------------------------------------------------------------ No results for input(s): TSH, T4TOTAL, T3FREE, THYROIDAB in the last 72 hours.  Invalid input(s): FREET3   Coagulation profile No results for input(s): INR, PROTIME in the last 168 hours. ------------------------------------------------------------------------------------------------------------------- No results for input(s): DDIMER in the last 72 hours. -------------------------------------------------------------------------------------------------------------------  Cardiac Enzymes Recent Labs  Lab 01/25/18 2210  TROPONINI <0.03   ------------------------------------------------------------------------------------------------------------------ Invalid input(s):  POCBNP  ---------------------------------------------------------------------------------------------------------------  Urinalysis    Component Value Date/Time   COLORURINE COLORLESS (A) 07/13/2015 1240   APPEARANCEUR CLEAR (A) 07/13/2015 1240   APPEARANCEUR Clear 11/25/2012 0913   LABSPEC 1.002 (L) 07/13/2015 1240   LABSPEC 1.002 11/25/2012 0913   PHURINE 7.0 07/13/2015 1240   GLUCOSEU 150 (A) 07/13/2015 1240   GLUCOSEU >=500 11/25/2012 0913   HGBUR NEGATIVE 07/13/2015 1240   BILIRUBINUR NEGATIVE 07/13/2015 1240   BILIRUBINUR Negative 11/25/2012 0913   KETONESUR NEGATIVE 07/13/2015 1240   PROTEINUR NEGATIVE 07/13/2015 1240   NITRITE NEGATIVE 07/13/2015 1240   LEUKOCYTESUR NEGATIVE 07/13/2015 1240   LEUKOCYTESUR Negative 11/25/2012 0913     RADIOLOGY: Dg Chest Port 1 View  Result Date: 01/25/2018 CLINICAL DATA:  Chest pain EXAM: PORTABLE CHEST 1 VIEW COMPARISON:  05/27/2015 FINDINGS: Unchanged moderate cardiomegaly. No focal airspace consolidation or pulmonary edema. No pleural effusion or pneumothorax. IMPRESSION: Cardiomegaly without pulmonary edema. Electronically Signed   By: Deatra Robinson M.D.   On: 01/25/2018 21:52   Ct Angio Chest/abd/pel For Dissection W And/or Wo Contrast  Result Date: 01/26/2018 CLINICAL DATA:  Chest and back pain.  Fall EXAM: CT ANGIOGRAPHY CHEST, ABDOMEN AND PELVIS TECHNIQUE: Initially, axial CT images were obtained through the chest without intravenous contrast material administration. Multidetector CT imaging through the chest, abdomen and pelvis was performed using the standard protocol during bolus administration of intravenous contrast. Multiplanar reconstructed images and MIPs were obtained and reviewed to evaluate the vascular anatomy. CONTRAST:  ISOVUE-370 IOPAMIDOL (ISOVUE-370) INJECTION 76% COMPARISON:  Chest radiograph January 25, 2018 FINDINGS: CTA CHEST FINDINGS Cardiovascular: There is no evident mediastinal hematoma in the thoracic  aorta on noncontrast enhanced study. There is no thoracic aortic aneurysm or dissection. Visualized great vessels appear unremarkable. There is no demonstrable pulmonary embolus. There is no pericardial effusion or pericardial thickening evident. Mediastinum/Nodes: Visualized thyroid appears unremarkable. There is no appreciable thoracic adenopathy. No esophageal lesions are appreciable. Lungs/Pleura: No pneumothorax. There is slight bibasilar atelectasis. There is no edema or consolidation. No pleural effusion or pleural thickening evident. On axial slice 50 series 12, there is a 2 mm nodular opacity in the anterior segment right upper lobe. Musculoskeletal: There are no blastic or lytic bone lesions. No appreciable fracture or dislocation. No evident chest wall lesions. There is benign calcification in the right breast. Review of the MIP images confirms the above findings. CTA ABDOMEN AND PELVIS FINDINGS VASCULAR Aorta: There is no thoracic aortic aneurysm or dissection. No appreciable atherosclerosis is noted in the aorta. Celiac: There is minimal calcification at the origin of the celiac artery. The celiac artery and its major branches are widely patent. No aneurysm or dissection in these vessels. SMA: There is minimal calcification at the origin of the superior mesenteric artery. There is focal calcification slightly more distally in the superior mesenteric artery causing 40-50% diameter stenosis focally. No aneurysm or dissection is seen in the superior mesenteric artery or its major branches. No obstruction in major branches evident. Renals: There is a single renal artery on each side. Renal arteries are widely patent bilaterally. No renal artery aneurysm or dissection. No  evident fibromuscular dysplasia. IMA: Inferior mesenteric artery is patent. Inferior mesenteric artery and its branches show no aneurysm or dissection. Inflow: There is slight calcification in each proximal common iliac artery with only  minimal narrowing in these areas. No hemodynamically significant obstruction noted in either common iliac artery. No aneurysm or dissection. There is modest atherosclerotic calcification at the origin of the left internal iliac artery. There is mild calcification in the midportion of the left inferior iliac artery. No aneurysm or dissection in either internal iliac artery. The external iliac arteries are widely patent bilaterally. No aneurysm or dissection. Common femoral arteries as well as proximal superficial and profunda femoral arteries are widely patent. No aneurysm or dissection evident in these vessels. Veins: No obvious venous abnormality within the limitations of this arterial phase study. Review of the MIP images confirms the above findings. NON-VASCULAR Hepatobiliary: There is a degree of hepatic steatosis. No focal liver lesions are evident. Gallbladder is absent. There is no biliary duct dilatation. Pancreas: There is no pancreatic mass or inflammatory focus. Spleen: No splenic lesions are evident. Adrenals/Urinary Tract: Adrenals bilaterally appear normal. Kidneys bilaterally show no evident mass or hydronephrosis on either side. There is no evident renal or ureteral calculus on either side. Urinary bladder is midline with wall thickness within normal limits. Stomach/Bowel: There is no appreciable bowel wall or mesenteric thickening. There is no evident bowel obstruction. There is no free air or portal venous air. Lymphatic: There is no evident adenopathy in the abdomen or pelvis. Reproductive: Uterus is anteverted.  No evident pelvic mass. Other: Appendix appears normal. There is no abscess or ascites in the abdomen or pelvis. There is a small ventral hernia containing only fat. Musculoskeletal: There is degenerative change in the lumbar spine. There is moderate spinal stenosis at L4-5 due to diffuse disc protrusion and bony hypertrophy. No blastic or lytic bone lesions. No fracture or dislocation  evident. No intramuscular lesions are evident. Review of the MIP images confirms the above findings. IMPRESSION: CT angiogram chest: 1. No thoracic aortic aneurysm or dissection. No evident pulmonary embolus. 2. No edema or consolidation. Mild bibasilar atelectasis. 2 mm nodular opacity right upper lobe. No follow-up needed if patient is low-risk. Non-contrast chest CT can be considered in 12 months if patient is high-risk. This recommendation follows the consensus statement: Guidelines for Management of Incidental Pulmonary Nodules Detected on CT Images: From the Fleischner Society 2017; Radiology 2017; 284:228-243. 3.  No evident thoracic adenopathy. CT angiogram abdomen; CT angiogram pelvis: 1. No aneurysm or dissection present in the aorta or major mesenteric/pelvic arterial vessels. 2. Scattered areas of atherosclerotic calcification without hemodynamically significant obstruction. 3.  Spinal stenosis at L4-5, moderate, multifactorial in etiology. 4. No evident bowel obstruction. No abscess in the abdomen or pelvis. Appendix appears normal. 5.  No evident renal or ureteral calculus.  No hydronephrosis. 6.  Small ventral hernia containing only fat. Electronically Signed   By: Bretta Bang III M.D.   On: 01/26/2018 00:00    EKG: Orders placed or performed during the hospital encounter of 01/25/18  . EKG 12-Lead  . EKG 12-Lead  . ED EKG  . ED EKG    IMPRESSION AND PLAN:  1.  Acute gastroenteritis, of unclear etiology, possibly viral in nature.  Continue supportive treatment with IV fluids and antinausea, pain medications. 2.  Chest pain, currently resolved.  This was possibly related to acid reflux status post vomiting.  We will treat with PPI and antinausea medications.  Will rule  out acute coronary syndrome.  Continue to monitor patient on telemetry and follow troponin levels.  2D echo to further evaluate the cardiac function. 3.  Hyponatremia, likely related to hypovolemia from nausea  vomiting.  We will start IV fluids and monitor sodium level closely. 4.  Hypokalemia, will replace potassium per protocol. 5.  Diabetes type 2.  Will monitor blood sugars before meals and at bedtime.  Will use insulin treatment during the hospital stay. 6.  Hypertension, stable, resume home medications.  All the records are reviewed and case discussed with ED provider. Management plans discussed with the patient, family and they are in agreement.  CODE STATUS: Full    TOTAL TIME TAKING CARE OF THIS PATIENT: 50 minutes.    Cammy Copa M.D on 01/26/2018 at 12:54 AM  Between 7am to 6pm - Pager - [phone]  After 6pm go to [email or web address] - password [email or web address] [facility] [place] at [facility]  [phone]  CC: Primary care physician; Gracelyn Nurse, MD

## 2018-01-26 NOTE — Clinical Social Work Note (Signed)
The CSW is aware through chart review that the patient is a resident of [facility]. She has a DSS guardian through DSS of [place]. CSW has updated [facility] on-call of admission. CSW has attempted to contact Durwin Nora with [name] and left a HIPPA compliant voicemail. CSW will enter a full assessment once both the patient's regular guardian and caregiver are in touch. CSW is following.  Argentina Ponder, MSW, Theresia Majors [phone]

## 2018-01-26 NOTE — Progress Notes (Signed)
Patient admitted this morning.  Agree with admitting MD plan.  Patient complaining of dysuria and therefore I will check UA  Sodium level improving with IV fluids  Follow-up on echocardiogram

## 2018-01-27 ENCOUNTER — Inpatient Hospital Stay
Admit: 2018-01-27 | Discharge: 2018-01-27 | Disposition: A | Discharge status: Home / Self Care | Payer: Medicare Other | Attending: Internal Medicine | Admitting: Internal Medicine

## 2018-01-27 LAB — BASIC METABOLIC PANEL
ANION GAP: 8 (ref 5–15)
BUN: 6 mg/dL (ref 6–20)
CHLORIDE: 97 mmol/L — AB (ref 98–111)
CO2: 29 mmol/L (ref 22–32)
Calcium: 8.9 mg/dL (ref 8.9–10.3)
Creatinine, Ser: 0.61 mg/dL (ref 0.44–1.00)
GFR calc non Af Amer: >60 mL/min (ref >60)
Glucose, Bld: 162 mg/dL — ABNORMAL HIGH (ref 70–99)
POTASSIUM: 3.5 mmol/L (ref 3.5–5.1)
Sodium: 134 mmol/L — ABNORMAL LOW (ref 135–145)

## 2018-01-27 LAB — GLUCOSE, CAPILLARY
GLUCOSE-CAPILLARY: 135 mg/dL — AB (ref 70–99)
GLUCOSE-CAPILLARY: 200 mg/dL — AB (ref 70–99)

## 2018-01-27 LAB — ECHOCARDIOGRAM COMPLETE: Weight: 3462.4 oz

## 2018-01-27 MED ORDER — AMLODIPINE BESYLATE 10 MG PO TABS
10.0000 mg | ORAL_TABLET | Freq: Every day | ORAL | Status: DC
  Administered 2018-01-27: 10 mg via ORAL
  Filled 2018-01-27: qty 1

## 2018-01-27 MED ORDER — ONDANSETRON HCL 4 MG PO TABS: 4.0000 mg | ORAL_TABLET | Freq: Four times a day (QID) | ORAL | 0 refills | Status: DC | PRN

## 2018-01-27 MED ORDER — HYDRALAZINE HCL 50 MG PO TABS
50.0000 mg | ORAL_TABLET | Freq: Three times a day (TID) | ORAL | Status: DC
  Administered 2018-01-27 (×2): 50 mg via ORAL
  Filled 2018-01-27 (×2): qty 1

## 2018-01-27 NOTE — Progress Notes (Signed)
*  PRELIMINARY RESULTS* Echocardiogram 2D Echocardiogram has been performed.  Rachel [name] 01/27/2018, 8:54 AM

## 2018-01-27 NOTE — Clinical Social Work Note (Signed)
Patient admitted last night and is discharging today. DSS [facility] notified of discharge. Lupita Leash at [name] is sending someone to pick patient up. York Spaniel MSW,LCSW [phone]

## 2018-01-27 NOTE — Progress Notes (Signed)
Pt discharged to return to [facility]. Discharge instructions reviewed with [name] representative. All questions answered to satisfaction. Pt chose to walk down to car.

## 2018-01-27 NOTE — Discharge Summary (Addendum)
[facility] [place] [facility]   PATIENT NAME: Rachel [name]    MR#:  [id number]  DATE OF BIRTH:  10-21-1960  DATE OF ADMISSION:  01/25/2018   ADMITTING PHYSICIAN: Cammy Copa, MD  DATE OF DISCHARGE: 01/27/18  PRIMARY CARE PHYSICIAN: Gracelyn Nurse, MD   ADMISSION DIAGNOSIS:  Abdominal pain, unspecified abdominal location [R10.9] Syncope, unspecified syncope type [R55] Chest pain, unspecified type [R07.9] Non-intractable vomiting with nausea, unspecified vomiting type [R11.2] DISCHARGE DIAGNOSIS:  Active Problems:   Acute gastroenteritis  SECONDARY DIAGNOSIS:   Past Medical History:  Diagnosis Date  . Cerebral palsy (HCC)   . Diabetes mellitus without complication (HCC)   . Hypercholesteremia   . Hypertension    HOSPITAL COURSE:   Rachel [name] is a 57 year old female with a history of cerebral palsy and MR who presented to the ED with intractable nausea and vomiting. She was felt to have acute gastroenteritis. She was treated with IV antiemetics and supportive care. She also complained of chest pain, but ACS was ruled out with negative troponins and ECHO without any wall abnormalities. CTA chest negative. She was hyponatremic, but this resolved with IVFs. She was discharged home with PCP follow-up.   DISCHARGE CONDITIONS:  Acute gastroenteritis, resolved Cerebral palsy Mental retardation Hypertension Type 2 diabetes Hyperlipidemia CONSULTS OBTAINED:  None DRUG ALLERGIES:   Allergies  Allergen Reactions  . Ace Inhibitors Swelling   DISCHARGE MEDICATIONS:   Allergies as of 01/27/2018      Reactions   Ace Inhibitors Swelling      Medication List    TAKE these medications   amLODipine 10 MG tablet Commonly known as:  NORVASC Take 10 mg by mouth daily.   ARIPiprazole 20 MG tablet Commonly known as:  ABILIFY Take 20 mg by mouth daily.   carbamazepine 200 MG tablet Commonly known as:  TEGRETOL Take 400 mg by mouth 2 (two) times  daily.   chlorthalidone 50 MG tablet Commonly known as:  HYGROTON Take 50 mg by mouth every other day.   furosemide 40 MG tablet Commonly known as:  LASIX Take 40 mg by mouth daily as needed for edema.   glipiZIDE 10 MG 24 hr tablet Commonly known as:  GLUCOTROL XL Take 10 mg by mouth daily with breakfast.   hydrALAZINE 50 MG tablet Commonly known as:  APRESOLINE Take 100 mg by mouth 3 (three) times daily.   LANTUS SOLOSTAR 100 UNIT/ML Solostar Pen Generic drug:  Insulin Glargine Inject 30 Units into the skin at bedtime.   loratadine 10 MG tablet Commonly known as:  CLARITIN Take 10 mg by mouth daily.   losartan 100 MG tablet Commonly known as:  COZAAR Take 100 mg by mouth daily.   magnesium oxide 400 MG tablet Commonly known as:  MAG-OX Take 400 mg by mouth daily.   metFORMIN 1000 MG tablet Commonly known as:  GLUCOPHAGE Take 1,000 mg by mouth 2 (two) times daily with a meal.   multivitamin with minerals Tabs tablet Take 1 tablet by mouth daily.   ondansetron 4 MG tablet Commonly known as:  ZOFRAN Take 1 tablet (4 mg total) by mouth every 6 (six) hours as needed for nausea.   pioglitazone 15 MG tablet Commonly known as:  ACTOS Take 15 mg by mouth daily.   polyethylene glycol packet Commonly known as:  MIRALAX / GLYCOLAX Take 17 g by mouth daily as needed for moderate constipation.   potassium chloride SA 20 MEQ tablet Commonly known  as:  K-DUR,KLOR-CON Take 20 mEq by mouth 2 (two) times daily.   pravastatin 80 MG tablet Commonly known as:  PRAVACHOL Take 80 mg by mouth at bedtime.   prazosin 2 MG capsule Commonly known as:  MINIPRESS Take 2 mg by mouth 2 (two) times daily.   ranitidine 150 MG tablet Commonly known as:  ZANTAC Take 150 mg by mouth 2 (two) times daily.   senna-docusate 8.6-50 MG tablet Commonly known as:  Senokot-S Take 1 tablet by mouth 2 (two) times daily.   traZODone 50 MG tablet Commonly known as:  DESYREL Take 50 mg by  mouth at bedtime.        DISCHARGE INSTRUCTIONS:  1. F/u with PCP in 1-2 weeks 2. Recheck BMP as an outpatient DIET:  Cardiac diet DISCHARGE CONDITION:  Stable ACTIVITY:  Activity as tolerated OXYGEN:  Home Oxygen: No.  Oxygen Delivery: room air DISCHARGE LOCATION:  group home   If you experience worsening of your admission symptoms, develop shortness of breath, life threatening emergency, suicidal or homicidal thoughts you must seek medical attention immediately by calling 911 or calling your MD immediately  if symptoms less severe.  You Must read complete instructions/literature along with all the possible adverse reactions/side effects for all the Medicines you take and that have been prescribed to you. Take any new Medicines after you have completely understood and accpet all the possible adverse reactions/side effects.   Please note  You were cared for by a hospitalist during your hospital stay. If you have any questions about your discharge medications or the care you received while you were in the hospital after you are discharged, you can call the unit and asked to speak with the hospitalist on call if the hospitalist that took care of you is not available. Once you are discharged, your primary care physician will handle any further medical issues. Please note that NO REFILLS for any discharge medications will be authorized once you are discharged, as it is imperative that you return to your primary care physician (or establish a relationship with a primary care physician if you do not have one) for your aftercare needs so that they can reassess your need for medications and monitor your lab values.    On the day of Discharge:  VITAL SIGNS:  Blood pressure (!) 151/74, pulse 71, temperature 98.4 F (36.9 C), temperature source Oral, resp. rate 16, weight 98.2 kg, SpO2 99 %. PHYSICAL EXAMINATION:  GENERAL:  57 y.o.-year-old patient lying in the bed with no acute distress.   EYES: Pupils equal, round, reactive to light and accommodation. No scleral icterus. Extraocular muscles intact.  HEENT: Head atraumatic, normocephalic. Oropharynx and nasopharynx clear.  NECK:  Supple, no jugular venous distention. No thyroid enlargement, no tenderness.  LUNGS: Normal breath sounds bilaterally, no wheezing, rales,rhonchi or crepitation. No use of accessory muscles of respiration.  CARDIOVASCULAR: S1, S2 normal. No murmurs, rubs, or gallops.  ABDOMEN: Soft, non-tender, non-distended. Bowel sounds present. No organomegaly or mass.  EXTREMITIES: No pedal edema, cyanosis, or clubbing.  NEUROLOGIC: Cranial nerves II through XII are intact. Muscle strength 5/5 in all extremities. Sensation intact. Gait not checked.  PSYCHIATRIC: The patient is alert and oriented x 3.  SKIN: No obvious rash, lesion, or ulcer.  DATA REVIEW:   CBC Recent Labs  Lab 01/26/18 0403  WBC 7.3  HGB 10.5*  HCT 31.6*  PLT 285    Chemistries  Recent Labs  Lab 01/25/18 2210  01/27/18 0546  NA  128*   < > 134*  K 3.2*   < > 3.5  CL 86*   < > 97*  CO2 29   < > 29  GLUCOSE 228*   < > 162*  BUN 7   < > 6  CREATININE 0.84   < > 0.61  CALCIUM 9.0   < > 8.9  AST 25  --   --   ALT 21  --   --   ALKPHOS 95  --   --   BILITOT 0.4  --   --    < > = values in this interval not displayed.     Microbiology Results  No results found for this or any previous visit.  RADIOLOGY:  No results found.   Management plans discussed with the patient, family and they are in agreement.  CODE STATUS: Full Code   TOTAL TIME TAKING CARE OF THIS PATIENT: 35 minutes.    Jinny Blossom Mayo M.D on 01/27/2018 at 2:56 PM  Between 7am to 6pm - Pager - [phone]  After 6pm go to [email or web address] - Social research officer, government  [facility] [place] [facility]  Office  [phone]  CC: Primary care physician; Gracelyn Nurse, MD   Note: This dictation was prepared with Dragon dictation along with smaller phrase  technology. Any transcriptional errors that result from this process are unintentional.

## 2018-01-27 NOTE — [place] FL2 (Signed)
[place] MEDICAID FL2 LEVEL OF CARE SCREENING TOOL     IDENTIFICATION  Patient Name: Rachel [name] Birthdate: 1960-08-17 Sex: female Admission Date (Current Location): 01/25/2018  [place] and [place] Number:  Chiropodist and Address:  [facility], [address] [place], [place] [address]      Provider Number: [id number]  Attending Physician Name and Address:  Mayo, Allyn Kenner, MD  Relative Name and Phone Number:       Current Level of Care: Hospital Recommended Level of Care: (group home) Prior Approval Number:    Date Approved/Denied:   PASRR Number:    Discharge Plan: (group home)    Current Diagnoses: Patient Active Problem List   Diagnosis Date Noted  . Acute gastroenteritis 01/26/2018  . 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 Aggression 07/13/2015  . Palpitations 07/06/2015  . Essential hypertension, benign 07/06/2015  . Obesity 07/06/2015  . Cerebral palsy (HCC) 12/24/2013  . Controlled type 2 diabetes mellitus without complication (HCC) 12/24/2013  . HLD (hyperlipidemia) 12/24/2013  . BP (high blood pressure) 12/24/2013  . Adiposity 12/24/2013  . Peripheral nerve disease 12/24/2013    Orientation RESPIRATION BLADDER Height & Weight     Self, Place  Normal Continent Weight: 216 lb 6.4 oz (98.2 kg) Height:     BEHAVIORAL SYMPTOMS/MOOD NEUROLOGICAL BOWEL NUTRITION STATUS  (none) Convulsions/Seizures Continent Diet(carb modified)  AMBULATORY STATUS COMMUNICATION OF NEEDS Skin   Supervision Verbally Normal                       Personal Care Assistance Level of Assistance  Bathing, Dressing, Feeding Bathing Assistance: Limited assistance Feeding assistance: Limited assistance Dressing Assistance: Limited assistance     Functional Limitations Info             SPECIAL CARE FACTORS FREQUENCY                       Contractures Contractures Info: Not present    Additional Factors Info   Code Status Code Status Info: full             DISCHARGE MEDICATIONS:        Allergies as of 01/27/2018      Reactions   Ace Inhibitors Swelling         Medication List    TAKE these medications   amLODipine 10 MG tablet Commonly known as:  NORVASC Take 10 mg by mouth daily.   ARIPiprazole 10 MG tablet Commonly known as:  ABILIFY Take 10 mg by mouth daily.   carbamazepine 200 MG tablet Commonly known as:  TEGRETOL Take 400 mg by mouth 2 (two) times daily.   chlorthalidone 25 MG tablet Commonly known as:  HYGROTON TAKE 1 TABLET BY MOUTH EVERY OTHER DAY.   fluticasone 50 MCG/ACT nasal spray Commonly known as:  FLONASE Place into the nose.   furosemide 40 MG tablet Commonly known as:  LASIX Take 40 mg by mouth daily as needed.   glipiZIDE 10 MG 24 hr tablet Commonly known as:  GLUCOTROL XL Take 10 mg by mouth daily with breakfast.   hydrALAZINE 100 MG tablet Commonly known as:  APRESOLINE Take 50 mg by mouth 3 (three) times daily.   LANTUS SOLOSTAR 100 UNIT/ML Solostar Pen Generic drug:  Insulin Glargine Inject 40 Units into the skin daily at 10 pm.   loratadine 10 MG tablet Commonly known as:  CLARITIN Take 10 mg by mouth daily.  losartan 100 MG tablet Commonly known as:  COZAAR Take 100 mg by mouth daily.   magnesium oxide 400 MG tablet Commonly known as:  MAG-OX Take 400 mg by mouth daily.   medroxyPROGESTERone 150 MG/ML injection Commonly known as:  DEPO-PROVERA Inject into the muscle.   metFORMIN 1000 MG tablet Commonly known as:  GLUCOPHAGE Take 1,000 mg by mouth 2 (two) times daily with a meal.   multivitamin with minerals Tabs tablet Take 1 tablet by mouth daily.   nystatin cream Commonly known as:  MYCOSTATIN Apply topically.   ondansetron 4 MG tablet Commonly known as:  ZOFRAN Take 4 mg by mouth 2 (two) times daily as needed for nausea or vomiting.   pioglitazone 30 MG tablet Commonly known as:   ACTOS Take 30 mg by mouth daily.   potassium chloride SA 20 MEQ tablet Commonly known as:  K-DUR,KLOR-CON Take 20 mEq by mouth 2 (two) times daily.   pravastatin 80 MG tablet Commonly known as:  PRAVACHOL Take 80 mg by mouth daily.   prazosin 2 MG capsule Commonly known as:  MINIPRESS Take 2 mg by mouth 2 (two) times daily.   ranitidine 150 MG tablet Commonly known as:  ZANTAC Take 150 mg by mouth 2 (two) times daily.   senna-docusate 8.6-50 MG tablet Commonly known as:  Senokot-S Take 1 tablet by mouth 2 (two) times daily.   triamcinolone cream 0.1 % Commonly known as:  KENALOG Apply topically.         Additional Information Follow Up Appt: 02/03/18 with Dr. Letitia Libra ; Can return to her Day Program on Wednesday 01/09/18  York Spaniel, LCSW

## 2018-01-27 NOTE — Evaluation (Signed)
Physical Therapy Evaluation Patient Details Name: Rachel [name] MRN: [id number] DOB: 1960-12-24 Today's Date: 01/27/2018   History of Present Illness  presented to ER secondary to chest and abdominal pain, nausea/vomiting; admitted for management of acute gastroenteritis.  Clinical Impression  Upon evaluation, patient alert and oriented to basic information; follows simple commands and demonstrates fair/good insight into mobility needs.  Bilat UE/LE strength and ROM grossly symmetrical and WFL for basic transfers and mobility; no focal weakness, pain reported.  Denies dizziness with transition to upright; vitals stable and WFL throughout (no orthostasis).  Able to complete sit/stand, basic transfers and gait (200') without assist device, distant sup/mod indep.  Mild sway with head turns during gait efforts, but self-corrects without LOB or safety concern. Mobility appears at/near baseline for patient; no acute PT needs identified at this time.  Will complete initial order; please re-consult should needs change.    Follow Up Recommendations No PT follow up    Equipment Recommendations       Recommendations for Other Services       Precautions / Restrictions Precautions Precautions: Fall Restrictions Weight Bearing Restrictions: No      Mobility  Bed Mobility               General bed mobility comments: in bathroom with CNA upon arrival to room; up in recliner end of session  Transfers Overall transfer level: Modified independent Equipment used: None Transfers: Sit to/from Stand Sit to Stand: Modified independent (Device/Increase time)         General transfer comment: good LE strength/power, minimal/no use of UEs for movement transitions  Ambulation/Gait Ambulation/Gait assistance: Supervision;Modified independent (Device/Increase time) Gait Distance (Feet): 200 Feet Assistive device: None   Gait velocity: 10' walk time, 7-8 seconds   General Gait Details:  reciprocal stepping pattern with fair cadence/gait speed; min cuing for normal cadence to maximize mechanics (trunk rotation and arm swing). Mild gait deviation with head turns, but able to self-correct without difficulty or LOB  Stairs            Wheelchair Mobility    Modified Rankin (Stroke Patients Only)       Balance Overall balance assessment: Needs assistance Sitting-balance support: No upper extremity supported;Feet supported Sitting balance-Leahy Scale: Good     Standing balance support: No upper extremity supported Standing balance-Leahy Scale: Fair                               Pertinent Vitals/Pain Pain Assessment: No/denies pain    Home Living Family/patient expects to be discharged to:: Group home                 Additional Comments: Resident of [name]    Prior Function Level of Independence: Independent         Comments: Indep without assist device for ADLs, mobility; assist from staff as needed for meals, meds     Hand Dominance        Extremity/Trunk Assessment   Upper Extremity Assessment Upper Extremity Assessment: Overall WFL for tasks assessed    Lower Extremity Assessment Lower Extremity Assessment: Overall WFL for tasks assessed(grossly at least 4/5)       Communication   Communication: No difficulties  Cognition Arousal/Alertness: Awake/alert Behavior During Therapy: WFL for tasks assessed/performed Overall Cognitive Status: History of cognitive impairments - at baseline  General Comments      Exercises     Assessment/Plan    PT Assessment Patent does not need any further PT services  PT Problem List         PT Treatment Interventions      PT Goals (Current goals can be found in the Care Plan section)  Acute Rehab PT Goals Patient Stated Goal: to return to group home PT Goal Formulation: All assessment and education  complete, DC therapy Time For Goal Achievement: 01/27/18 Potential to Achieve Goals: Good    Frequency     Barriers to discharge        Co-evaluation               AM-PAC PT "6 Clicks" Daily Activity  Outcome Measure Difficulty turning over in bed (including adjusting bedclothes, sheets and blankets)?: None Difficulty moving from lying on back to sitting on the side of the bed? : None Difficulty sitting down on and standing up from a chair with arms (e.g., wheelchair, bedside commode, etc,.)?: None Help needed moving to and from a bed to chair (including a wheelchair)?: None Help needed walking in hospital room?: None Help needed climbing 3-5 steps with a railing? : A Little 6 Click Score: 23    End of Session Equipment Utilized During Treatment: Gait belt Activity Tolerance: Patient tolerated treatment well Patient left: in chair;with call bell/phone within reach;with chair alarm set Nurse Communication: Mobility status PT Visit Diagnosis: Difficulty in walking, not elsewhere classified (R26.2)    Time: 4098-1191 PT Time Calculation (min) (ACUTE ONLY): 15 min   Charges:   PT Evaluation $PT Eval Low Complexity: 1 Low          Jameila Keeny H. Manson Passey, PT, DPT, NCS 01/27/18, 1:06 PM [phone]

## 2018-01-28 LAB — HIV ANTIBODY (ROUTINE TESTING W REFLEX): HIV SCREEN 4TH GENERATION: NONREACTIVE

## 2018-02-17 ENCOUNTER — Telehealth: Payer: Self-pay | Admitting: Podiatry

## 2018-02-17 NOTE — Telephone Encounter (Signed)
Rachel [name] had left a message asking about pts diabetic shoes.  I returned call after researching that the pt did not come in for her 10.21.appt and the authorization for her shoes has expired and I am faxing now the paperwork to see if they will refill it out. We will call when auth  comes in to schedule pt for an appt.

## 2018-02-25 ENCOUNTER — Other Ambulatory Visit: Payer: Self-pay

## 2018-02-25 ENCOUNTER — Ambulatory Visit: Payer: Medicare Other

## 2018-02-25 ENCOUNTER — Ambulatory Visit
Admit: 2018-02-25 | Discharge: 2018-02-25 | Disposition: A | Discharge status: Home / Self Care | Payer: Medicare Other | Attending: Family Medicine | Admitting: Family Medicine

## 2018-02-25 ENCOUNTER — Ambulatory Visit
Admission: EM | Admit: 2018-02-25 | Discharge: 2018-02-25 | Disposition: A | Discharge status: Home / Self Care | Payer: Medicare Other | Attending: Family Medicine | Admitting: Family Medicine

## 2018-02-25 ENCOUNTER — Encounter: Payer: Self-pay | Admitting: Emergency Medicine

## 2018-02-25 DIAGNOSIS — Z888 Allergy status to other drugs, medicaments and biological substances status: Secondary | ICD-10-CM | POA: Insufficient documentation

## 2018-02-25 DIAGNOSIS — W19XXXA Unspecified fall, initial encounter: Secondary | ICD-10-CM | POA: Diagnosis not present

## 2018-02-25 DIAGNOSIS — Z79899 Other long term (current) drug therapy: Secondary | ICD-10-CM | POA: Insufficient documentation

## 2018-02-25 DIAGNOSIS — G809 Cerebral palsy, unspecified: Secondary | ICD-10-CM | POA: Insufficient documentation

## 2018-02-25 DIAGNOSIS — I10 Essential (primary) hypertension: Secondary | ICD-10-CM | POA: Diagnosis not present

## 2018-02-25 DIAGNOSIS — E119 Type 2 diabetes mellitus without complications: Secondary | ICD-10-CM | POA: Insufficient documentation

## 2018-02-25 DIAGNOSIS — S0990XA Unspecified injury of head, initial encounter: Secondary | ICD-10-CM | POA: Insufficient documentation

## 2018-02-25 DIAGNOSIS — Z794 Long term (current) use of insulin: Secondary | ICD-10-CM | POA: Diagnosis not present

## 2018-02-25 DIAGNOSIS — M940 Chondrocostal junction syndrome [Tietze]: Secondary | ICD-10-CM | POA: Diagnosis not present

## 2018-02-25 DIAGNOSIS — E7800 Pure hypercholesterolemia, unspecified: Secondary | ICD-10-CM | POA: Diagnosis not present

## 2018-02-25 DIAGNOSIS — S0003XA Contusion of scalp, initial encounter: Secondary | ICD-10-CM | POA: Insufficient documentation

## 2018-02-25 NOTE — ED Triage Notes (Signed)
Patient states that she fell today and hit the back of her head on the pavement.  Patient denies LOC.

## 2018-03-05 ENCOUNTER — Ambulatory Visit (INDEPENDENT_AMBULATORY_CARE_PROVIDER_SITE_OTHER): Payer: Medicare Other | Admitting: Orthotics

## 2018-03-05 DIAGNOSIS — M2022 Hallux rigidus, left foot: Secondary | ICD-10-CM | POA: Diagnosis not present

## 2018-03-05 DIAGNOSIS — E1142 Type 2 diabetes mellitus with diabetic polyneuropathy: Secondary | ICD-10-CM | POA: Diagnosis not present

## 2018-03-05 DIAGNOSIS — M205X1 Other deformities of toe(s) (acquired), right foot: Secondary | ICD-10-CM

## 2018-03-05 DIAGNOSIS — M205X2 Other deformities of toe(s) (acquired), left foot: Secondary | ICD-10-CM

## 2018-03-05 NOTE — Progress Notes (Signed)

## 2018-03-14 NOTE — ED Provider Notes (Addendum)
[facility]    CSN: [id number] Arrival date & time: 02/25/18  1721     History   Chief Complaint Chief Complaint  Patient presents with  . Fall  . Head Injury    HPI Rachel [name] is a 57 y.o. female.   57 yo female with a h/o cerebral palsy, cognitive disability, presents with caregiver with a concern of a fall today and subsequent head pain, headache, neck pain, and chest wall pain.  Patient fell while trying to get into a car, falling backwards and hitting her head on the pavement. Denies loss of consciousness, vision changes, vomiting.   The history is provided by a caregiver.  Fall   Head Injury    Past Medical History:  Diagnosis Date  . Cerebral palsy (HCC)   . Diabetes mellitus without complication (HCC)   . Hypercholesteremia   . Hypertension     Patient Active Problem List   Diagnosis Date Noted  . Acute gastroenteritis 01/26/2018  . 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 Aggression 07/13/2015  . Palpitations 07/06/2015  . Essential hypertension, benign 07/06/2015  . Obesity 07/06/2015  . Cerebral palsy (HCC) 12/24/2013  . Controlled type 2 diabetes mellitus without complication (HCC) 12/24/2013  . HLD (hyperlipidemia) 12/24/2013  . BP (high blood pressure) 12/24/2013  . Adiposity 12/24/2013  . Peripheral nerve disease 12/24/2013    Past Surgical History:  Procedure Laterality Date  . DILATION AND CURETTAGE OF UTERUS      OB History   No obstetric history on file.      Home Medications    Prior to Admission medications   Medication Sig Start Date End Date Taking? Authorizing Provider  amLODipine (NORVASC) 10 MG tablet Take 10 mg by mouth daily.    Yes [provider]  ARIPiprazole (ABILIFY) 20 MG tablet Take 20 mg by mouth daily.    Yes [provider]  carbamazepine (TEGRETOL) 200 MG tablet Take 400 mg by mouth 2 (two) times daily.    Yes [provider]  chlorthalidone (HYGROTON) 50 MG  tablet Take 50 mg by mouth every other day.   Yes [provider]  fluticasone (FLONASE) 50 MCG/ACT nasal spray Place 1 spray into both nostrils daily.   Yes [provider]  furosemide (LASIX) 40 MG tablet Take 40 mg by mouth daily as needed for edema.    Yes [provider]  glipiZIDE (GLUCOTROL XL) 10 MG 24 hr tablet Take 10 mg by mouth daily with breakfast.  09/20/14  Yes [provider]  hydrALAZINE (APRESOLINE) 50 MG tablet Take 100 mg by mouth 3 (three) times daily.    Yes [provider]  Insulin Glargine (LANTUS SOLOSTAR) 100 UNIT/ML Solostar Pen Inject 30 Units into the skin at bedtime.    Yes [provider]  loratadine (CLARITIN) 10 MG tablet Take 10 mg by mouth daily.  09/21/14  Yes [provider]  losartan (COZAAR) 100 MG tablet Take 100 mg by mouth daily.  01/04/15 02/25/18 Yes [provider]  magnesium oxide (MAG-OX) 400 MG tablet Take 400 mg by mouth daily.   Yes [provider]  medroxyPROGESTERone (DEPO-PROVERA) 150 MG/ML injection Inject 150 mg into the muscle every 3 (three) months.   Yes [provider]  metFORMIN (GLUCOPHAGE) 1000 MG tablet Take 1,000 mg by mouth 2 (two) times daily with a meal.  02/21/15  Yes [provider]  Multiple Vitamin (MULTIVITAMIN WITH MINERALS) TABS tablet Take 1 tablet by  mouth daily.   Yes [provider]  polyethylene glycol (MIRALAX / GLYCOLAX) packet Take 17 g by mouth daily as needed for moderate constipation.   Yes [provider]  potassium chloride SA (K-DUR,KLOR-CON) 20 MEQ tablet Take 20 mEq by mouth 2 (two) times daily.  04/13/14  Yes [provider]  pravastatin (PRAVACHOL) 80 MG tablet Take 80 mg by mouth at bedtime.    Yes [provider]  prazosin (MINIPRESS) 2 MG capsule Take 2 mg by mouth 2 (two) times daily.  09/20/14  Yes [provider]  ranitidine (ZANTAC) 150 MG tablet Take 150 mg by mouth  2 (two) times daily.   Yes [provider]  senna-docusate (SENOKOT-S) 8.6-50 MG tablet Take 1 tablet by mouth 2 (two) times daily.   Yes [provider]  traZODone (DESYREL) 50 MG tablet Take 50 mg by mouth at bedtime.   Yes [provider]  ondansetron (ZOFRAN) 4 MG tablet Take 1 tablet (4 mg total) by mouth every 6 (six) hours as needed for nausea. 01/27/18   Mayo, Allyn KennerKaty Dodd, MD  pioglitazone (ACTOS) 15 MG tablet Take 15 mg by mouth daily.     [provider]    Family History Family History  Family history unknown: Yes    Social History Social History   Tobacco Use  . Smoking status: Never Smoker  . Smokeless tobacco: Never Used  Substance Use Topics  . Alcohol use: No    Alcohol/week: 0.0 standard drinks  . Drug use: No     Allergies   Ace inhibitors   Review of Systems Review of Systems   Physical Exam Triage Vital Signs ED Triage Vitals  Enc Vitals Group     BP 02/25/18 1739 140/71     Pulse Rate 02/25/18 1739 81     Resp 02/25/18 1739 16     Temp 02/25/18 1739 97.8 F (36.6 C)     Temp Source 02/25/18 1739 Oral     SpO2 02/25/18 1739 100 %     Weight 02/25/18 1731 216 lb 7.9 oz (98.2 kg)     Height --      Head Circumference --      Peak Flow --      Pain Score 02/25/18 1730 6     Pain Loc --      Pain Edu? --      Excl. in GC? --    No data found.  Updated Vital Signs BP 140/71 (BP Location: Left Arm)   Pulse 81   Temp 97.8 F (36.6 C) (Oral)   Resp 16   Wt 98.2 kg   SpO2 100%   BMI 34.94 kg/m   Visual Acuity Right Eye Distance:   Left Eye Distance:   Bilateral Distance:    Right Eye Near:   Left Eye Near:    Bilateral Near:     Physical Exam Vitals signs reviewed.  Constitutional:      General: She is not in acute distress.    Appearance: She is well-developed. She is not toxic-appearing or diaphoretic.  HENT:     Head: Normocephalic. Contusion (and hematoma noted to posterior scalp; tender)  present.     Right Ear: Tympanic membrane, ear canal and external ear normal.     Left Ear: Tympanic membrane, ear canal and external ear normal.     Nose: Nose normal.  Eyes:     Extraocular Movements: Extraocular movements intact.  Pupils: Pupils are equal, round, and reactive to light.  Neck:     Musculoskeletal: Normal range of motion and neck supple.     Thyroid: No thyromegaly.     Vascular: No JVD.     Trachea: No tracheal deviation.  Cardiovascular:     Rate and Rhythm: Normal rate and regular rhythm.     Heart sounds: Normal heart sounds. No murmur.  Pulmonary:     Effort: Pulmonary effort is normal. No respiratory distress.     Breath sounds: Normal breath sounds. No stridor. No wheezing, rhonchi or rales.  Chest:     Chest wall: Tenderness present.  Abdominal:     Tenderness: There is no abdominal tenderness.  Musculoskeletal:     Cervical back: She exhibits tenderness, bony tenderness and pain. She exhibits normal range of motion, no swelling, no edema, no deformity, no laceration, no spasm and normal pulse.  Lymphadenopathy:     Cervical: No cervical adenopathy.  Skin:    General: Skin is warm and dry.     Coloration: Skin is not pale.     Findings: No erythema or rash.  Neurological:     Mental Status: She is alert and oriented to person, place, and time.     Cranial Nerves: No cranial nerve deficit.     Sensory: No sensory deficit.     Motor: No weakness.     Coordination: Coordination normal.     Gait: Gait normal.     Deep Tendon Reflexes: Reflexes are normal and symmetric. Reflexes normal.      UC Treatments / Results  Labs (all labs ordered are listed, but only abnormal results are displayed) Labs Reviewed - No data to display  EKG None  Radiology No results found.  Procedures Procedures (including critical care time)  Medications Ordered in UC Medications - No data to display  Initial Impression / Assessment and Plan / UC Course  I  have reviewed the triage vital signs and the nursing notes.  Pertinent labs & imaging results that were available during my care of the patient were reviewed by me and considered in my medical decision making (see chart for details).      Final Clinical Impressions(s) / UC Diagnoses   Final diagnoses:  Contusion of scalp, initial encounter  Injury of head, initial encounter  Costochondritis    ED Prescriptions    None     1. x-ray/CT results (negative) and diagnosis reviewed with patient and caregiver  2. Recommend supportive treatment with otc analgesics prn, rest, ice 3. Follow-up prn if symptoms worsen or don't improve  Controlled Substance Prescriptions [place] Controlled Substance Registry consulted? Not Applicable   Payton Mccallum, MD 03/14/18 [id number]    Payton Mccallum, MD 03/14/18 2025

## 2018-03-31 ENCOUNTER — Other Ambulatory Visit: Payer: Self-pay | Admitting: Internal Medicine

## 2018-03-31 DIAGNOSIS — Z1231 Encounter for screening mammogram for malignant neoplasm of breast: Secondary | ICD-10-CM

## 2018-05-20 ENCOUNTER — Ambulatory Visit
Admission: RE | Admit: 2018-05-20 | Discharge: 2018-05-20 | Disposition: A | Discharge status: Home / Self Care | Payer: Medicare Other | Source: Ambulatory Visit | Attending: Internal Medicine | Admitting: Internal Medicine

## 2018-05-20 DIAGNOSIS — Z1231 Encounter for screening mammogram for malignant neoplasm of breast: Secondary | ICD-10-CM | POA: Diagnosis present

## 2019-02-02 ENCOUNTER — Ambulatory Visit (INDEPENDENT_AMBULATORY_CARE_PROVIDER_SITE_OTHER): Payer: Medicare Other | Admitting: Podiatry

## 2019-02-02 ENCOUNTER — Other Ambulatory Visit: Payer: Self-pay

## 2019-02-02 ENCOUNTER — Encounter: Payer: Self-pay | Admitting: Podiatry

## 2019-02-02 DIAGNOSIS — M79674 Pain in right toe(s): Secondary | ICD-10-CM

## 2019-02-02 DIAGNOSIS — E119 Type 2 diabetes mellitus without complications: Secondary | ICD-10-CM | POA: Diagnosis not present

## 2019-02-02 DIAGNOSIS — M79675 Pain in left toe(s): Secondary | ICD-10-CM | POA: Diagnosis not present

## 2019-02-02 DIAGNOSIS — B351 Tinea unguium: Secondary | ICD-10-CM | POA: Insufficient documentation

## 2019-02-02 NOTE — Progress Notes (Signed)
Complaint:  Visit Type: Patient returns to my office for continued preventative foot care services. Complaint: Patient states" my nails have grown long and thick and become painful to walk and wear shoes" Patient has been diagnosed with DM with no foot complications. The patient presents for preventative foot care services.  Patient has not seen seen in over one year.  Podiatric Exam: Vascular: dorsalis pedis and posterior tibial pulses are palpable bilateral. Capillary return is immediate. Temperature gradient is WNL. Skin turgor WNL  Sensorium: Diminished  Semmes Weinstein monofilament test. Diminished tactile sensation bilaterally. Nail Exam: Pt has thick disfigured discolored nails with subungual debris noted bilateral entire nail hallux through third toenails B/L. Ulcer Exam: There is no evidence of ulcer or pre-ulcerative changes or infection. Orthopedic Exam: Muscle tone and strength are WNL. No limitations in general ROM. No crepitus or effusions noted. Foot type and digits show no abnormalities. Bony prominences are unremarkable. Skin: No Porokeratosis. No infection or ulcers  Diagnosis:  Onychomycosis, , Pain in right toe, pain in left toes  Treatment & Plan Procedures and Treatment: Consent by patient was obtained for treatment procedures.   Debridement of mycotic and hypertrophic toenails, 1 through 3  bilateral and clearing of subungual debris. No ulceration, no infection noted.  Return Visit-Office Procedure: Patient instructed to return to the office for a follow up visit 3 months for continued evaluation and treatment.    Gardiner Barefoot DPM

## 2019-04-16 ENCOUNTER — Other Ambulatory Visit: Payer: Self-pay | Admitting: Internal Medicine

## 2019-04-16 DIAGNOSIS — Z1231 Encounter for screening mammogram for malignant neoplasm of breast: Secondary | ICD-10-CM

## 2019-04-18 ENCOUNTER — Emergency Department: Payer: Medicare Other

## 2019-04-18 ENCOUNTER — Emergency Department
Admission: EM | Admit: 2019-04-18 | Discharge: 2019-04-19 | Disposition: A | Discharge status: Home / Self Care | Payer: Medicare Other | Attending: Emergency Medicine | Admitting: Emergency Medicine

## 2019-04-18 ENCOUNTER — Other Ambulatory Visit: Payer: Self-pay

## 2019-04-18 ENCOUNTER — Encounter: Payer: Self-pay | Admitting: Emergency Medicine

## 2019-04-18 DIAGNOSIS — Z79899 Other long term (current) drug therapy: Secondary | ICD-10-CM | POA: Diagnosis not present

## 2019-04-18 DIAGNOSIS — R110 Nausea: Secondary | ICD-10-CM | POA: Insufficient documentation

## 2019-04-18 DIAGNOSIS — I10 Essential (primary) hypertension: Secondary | ICD-10-CM | POA: Diagnosis not present

## 2019-04-18 DIAGNOSIS — E871 Hypo-osmolality and hyponatremia: Secondary | ICD-10-CM | POA: Diagnosis not present

## 2019-04-18 DIAGNOSIS — Z794 Long term (current) use of insulin: Secondary | ICD-10-CM | POA: Insufficient documentation

## 2019-04-18 DIAGNOSIS — R55 Syncope and collapse: Secondary | ICD-10-CM | POA: Diagnosis not present

## 2019-04-18 DIAGNOSIS — E119 Type 2 diabetes mellitus without complications: Secondary | ICD-10-CM | POA: Insufficient documentation

## 2019-04-18 DIAGNOSIS — R42 Dizziness and giddiness: Secondary | ICD-10-CM | POA: Diagnosis present

## 2019-04-18 LAB — BASIC METABOLIC PANEL
Anion gap: 15 (ref 5–15)
BUN: 8 mg/dL (ref 6–20)
CO2: 28 mmol/L (ref 22–32)
Calcium: 9.4 mg/dL (ref 8.9–10.3)
Chloride: 81 mmol/L — ABNORMAL LOW (ref 98–111)
Creatinine, Ser: 0.82 mg/dL (ref 0.44–1.00)
GFR calc Af Amer: >60 mL/min (ref >60)
GFR calc non Af Amer: >60 mL/min (ref >60)
Glucose, Bld: 204 mg/dL — ABNORMAL HIGH (ref 70–99)
Potassium: 3 mmol/L — ABNORMAL LOW (ref 3.5–5.1)
Sodium: 124 mmol/L — ABNORMAL LOW (ref 135–145)

## 2019-04-18 LAB — URINALYSIS, COMPLETE (UACMP) WITH MICROSCOPIC
Bacteria, UA: NONE SEEN
Bilirubin Urine: NEGATIVE
Glucose, UA: 50 mg/dL — AB
Hgb urine dipstick: NEGATIVE
Ketones, ur: NEGATIVE mg/dL
Leukocytes,Ua: NEGATIVE
Nitrite: NEGATIVE
Protein, ur: 100 mg/dL — AB
Specific Gravity, Urine: 1.017 (ref 1.005–1.030)
pH: 7 (ref 5.0–8.0)

## 2019-04-18 LAB — CBC
HCT: 35 % — ABNORMAL LOW (ref 36.0–46.0)
Hemoglobin: 11.9 g/dL — ABNORMAL LOW (ref 12.0–15.0)
MCH: 30.4 pg (ref 26.0–34.0)
MCHC: 34 g/dL (ref 30.0–36.0)
MCV: 89.5 fL (ref 80.0–100.0)
Platelets: 273 10*3/uL (ref 150–400)
RBC: 3.91 MIL/uL (ref 3.87–5.11)
RDW: 12.1 % (ref 11.5–15.5)
WBC: 7.1 10*3/uL (ref 4.0–10.5)
nRBC: 0 % (ref 0.0–0.2)

## 2019-04-18 MED ORDER — ONDANSETRON HCL 4 MG PO TABS: 4.0000 mg | ORAL_TABLET | Freq: Once | ORAL | Status: DC

## 2019-04-18 MED ORDER — ONDANSETRON 4 MG PO TBDP
ORAL_TABLET | ORAL | Status: AC
  Administered 2019-04-18: 14:00:00 4 mg
  Filled 2019-04-18: qty 1

## 2019-04-18 MED ORDER — SODIUM CHLORIDE 0.9 % IV BOLUS
1000.0000 mL | Freq: Once | INTRAVENOUS | Status: AC
  Administered 2019-04-18: 22:00:00 1000 mL via INTRAVENOUS

## 2019-04-18 MED ORDER — SODIUM CHLORIDE 0.9 % IV BOLUS: 1000.0000 mL | Freq: Once | INTRAVENOUS | Status: DC

## 2019-04-18 NOTE — ED Notes (Signed)
Pt A&Ox4 and in NAD, to hallway bed via wheelchair. Pt speech clear. Reports falling while trying to get out of the shower earlier today. States she hit her head as well as both elbows. No obvious deformities observed.

## 2019-04-18 NOTE — Discharge Instructions (Signed)
Return to the ER for new, worsening, or persistent severe dizziness, weakness, feeling like you are going to pass out, severe headache, vomiting, or any other new or worsening symptoms that concern you.  Follow-up with your primary care doctor within 1 to 2 weeks.

## 2019-04-18 NOTE — ED Notes (Signed)
Caregiver at bedside. Pt given [place] sandwich tray and cola

## 2019-04-18 NOTE — ED Notes (Addendum)
IV access x 2 by this RN without success and x1 by Lurena Joiner RN without success, Dr. Marisa Severin informed, will place IV consult

## 2019-04-18 NOTE — ED Notes (Signed)
Attempted to call pt's group home rep to inform her that she is no longer in the waiting room but it went straight to voicemail

## 2019-04-18 NOTE — ED Notes (Signed)
Called and spoke with caregiver from group home, Arline Asp, Arline Asp states that pt is competent to be seen on her own. Arline Asp is going to go back to the group home and will come get patient when she is discharged.   Cindy: [phone]

## 2019-04-18 NOTE — ED Notes (Signed)
Pt assisted to restroom by this RN, pt able to transfer from bed to wheelchair and wheelchair to toilet with standby assist.

## 2019-04-18 NOTE — ED Notes (Signed)
Pt states she has a legal guardian named Olegario Messier, but does not know her contact information. None seen in the chart at this time.

## 2019-04-18 NOTE — ED Triage Notes (Signed)
Pt arrived via [facility] from [facility], reports pt felt dizzy after shower, pt states she fell and hit the back of her head in the shower.  Pt states she was given Tylenol  VSS.

## 2019-04-18 NOTE — ED Provider Notes (Signed)
[facility] Provider Note ____________________________________________   First MD Initiated Contact with Patient 04/18/19 2008     (approximate)  I have reviewed the triage vital signs and the nursing notes.   HISTORY  Chief Complaint Dizziness   HPI Rachel [name] is a 59 y.o. female with PMH as noted below who presents with dizziness, described as feeling lightheaded, occurring since this morning and worse after the patient came out of the shower.  She states that she became so dizzy that she fell, hitting her head and her elbows.  She states that she also hit her buttocks.  She states she had some nausea earlier but this has resolved.  She denies any severe headache or vomiting.  She states that otherwise she has been feeling well in the last few days.  She denies any chest pain or shortness of breath.  Past Medical History:  Diagnosis Date  . Cerebral palsy (HCC)   . Diabetes mellitus without complication (HCC)   . Hypercholesteremia   . Hypertension     Patient Active Problem List   Diagnosis Date Noted  . Pain due to onychomycosis of toenails of both feet 02/02/2019  . Acute gastroenteritis 01/26/2018  . 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 Aggression 07/13/2015  . Palpitations 07/06/2015  . Essential hypertension, benign 07/06/2015  . Obesity 07/06/2015  . Cerebral palsy (HCC) 12/24/2013  . Controlled type 2 diabetes mellitus without complication (HCC) 12/24/2013  . HLD (hyperlipidemia) 12/24/2013  . BP (high blood pressure) 12/24/2013  . Adiposity 12/24/2013  . Peripheral nerve disease 12/24/2013    Past Surgical History:  Procedure Laterality Date  . DILATION AND CURETTAGE OF UTERUS      Prior to Admission medications   Medication Sig Start Date End Date Taking? Authorizing Provider  amLODipine (NORVASC) 10 MG tablet Take 10 mg by mouth daily.     [provider]  ARIPiprazole (ABILIFY) 20 MG  tablet Take 20 mg by mouth daily.     [provider]  carbamazepine (TEGRETOL) 200 MG tablet Take 400 mg by mouth 2 (two) times daily.     [provider]  chlorthalidone (HYGROTON) 50 MG tablet Take 50 mg by mouth every other day.    [provider]  fluticasone (FLONASE) 50 MCG/ACT nasal spray Place 1 spray into both nostrils daily.    [provider]  furosemide (LASIX) 40 MG tablet Take 40 mg by mouth daily as needed for edema.     [provider]  glipiZIDE (GLUCOTROL XL) 10 MG 24 hr tablet Take 10 mg by mouth daily with breakfast.  09/20/14   [provider]  hydrALAZINE (APRESOLINE) 50 MG tablet Take 100 mg by mouth 3 (three) times daily.     [provider]  Insulin Glargine (LANTUS SOLOSTAR) 100 UNIT/ML Solostar Pen Inject 30 Units into the skin at bedtime.     [provider]  loratadine (CLARITIN) 10 MG tablet Take 10 mg by mouth daily.  09/21/14   [provider]  losartan (COZAAR) 100 MG tablet Take 100 mg by mouth daily.  01/04/15 02/25/18  [provider]  magnesium oxide (MAG-OX) 400 MG tablet Take 400 mg by mouth daily.    [provider]  medroxyPROGESTERone (DEPO-PROVERA) 150 MG/ML injection Inject 150 mg into the muscle every 3 (three) months.    [provider]  metFORMIN (GLUCOPHAGE) 1000 MG tablet Take 1,000 mg by mouth 2 (two) times daily with  a meal.  02/21/15   [provider]  Multiple Vitamin (MULTIVITAMIN WITH MINERALS) TABS tablet Take 1 tablet by mouth daily.    [provider]  ondansetron (ZOFRAN) 4 MG tablet Take 1 tablet (4 mg total) by mouth every 6 (six) hours as needed for nausea. 01/27/18   Mayo, Allyn Kenner, MD  pioglitazone (ACTOS) 15 MG tablet Take 15 mg by mouth daily.     [provider]  polyethylene glycol (MIRALAX / GLYCOLAX) packet Take 17 g by mouth daily as needed for moderate constipation.    [provider]    potassium chloride SA (K-DUR,KLOR-CON) 20 MEQ tablet Take 20 mEq by mouth 2 (two) times daily.  04/13/14   [provider]  pravastatin (PRAVACHOL) 80 MG tablet Take 80 mg by mouth at bedtime.     [provider]  prazosin (MINIPRESS) 2 MG capsule Take 2 mg by mouth 2 (two) times daily.  09/20/14   [provider]  ranitidine (ZANTAC) 150 MG tablet Take 150 mg by mouth 2 (two) times daily.    [provider]  senna-docusate (SENOKOT-S) 8.6-50 MG tablet Take 1 tablet by mouth 2 (two) times daily.    [provider]  traZODone (DESYREL) 50 MG tablet Take 50 mg by mouth at bedtime.    [provider]    Allergies Ace inhibitors  Family History  Family history unknown: Yes    Social History Social History   Tobacco Use  . Smoking status: Never Smoker  . Smokeless tobacco: Never Used  Substance Use Topics  . Alcohol use: No    Alcohol/week: 0.0 standard drinks  . Drug use: No    Review of Systems  Constitutional: No fever. Eyes: No redness. ENT: No sore throat. Cardiovascular: Denies chest pain. Respiratory: Denies shortness of breath. Gastrointestinal: No vomiting or diarrhea.  Genitourinary: Negative for dysuria.  Musculoskeletal: Negative for neck or back pain. Skin: Negative for rash. Neurological: Negative for headaches, focal weakness or numbness.   ____________________________________________   PHYSICAL EXAM:  VITAL SIGNS: ED Triage Vitals  Enc Vitals Group     BP 04/18/19 1140 (!) 142/52     Pulse Rate 04/18/19 1140 96     Resp 04/18/19 1140 18     Temp 04/18/19 1140 98.1 F (36.7 C)     Temp Source 04/18/19 1140 Oral     SpO2 04/18/19 1140 98 %     Weight 04/18/19 1141 220 lb (99.8 kg)     Height 04/18/19 1141 5\' 4"  (1.626 m)     Head Circumference --      Peak Flow --      Pain Score --      Pain Loc --      Pain Edu? --      Excl. in GC? --     Constitutional: Alert and oriented.  Relatively  well appearing and in no acute distress. Eyes: Conjunctivae are normal.  EOMI. Head: Atraumatic. Nose: No congestion/rhinnorhea. Mouth/Throat: Mucous membranes are moist.   Neck: Normal range of motion.  Cardiovascular: Normal rate, regular rhythm.  Good peripheral circulation. Respiratory: Normal respiratory effort.  No retractions.  Gastrointestinal: Soft and nontender. No distention.  Genitourinary: No flank tenderness. Musculoskeletal: No lower extremity edema.  Extremities warm and well perfused.  Full range of motion bilateral upper and lower extremities. Neurologic:  Normal speech and language.  Motor intact in all extremities.  Normal coordination.   Skin:  Skin is warm and  dry. No rash noted. Psychiatric: Mood and affect are normal. Speech and behavior are normal.  ____________________________________________   LABS (all labs ordered are listed, but only abnormal results are displayed)  Labs Reviewed  BASIC METABOLIC PANEL - Abnormal; Notable for the following components:      Result Value   Sodium 124 (*)    Potassium 3.0 (*)    Chloride 81 (*)    Glucose, Bld 204 (*)    All other components within normal limits  CBC - Abnormal; Notable for the following components:   Hemoglobin 11.9 (*)    HCT 35.0 (*)    All other components within normal limits  URINALYSIS, COMPLETE (UACMP) WITH MICROSCOPIC - Abnormal; Notable for the following components:   Color, Urine YELLOW (*)    APPearance HAZY (*)    Glucose, UA 50 (*)    Protein, ur 100 (*)    All other components within normal limits  BASIC METABOLIC PANEL  CBG MONITORING, ED  POC URINE PREG, ED   ____________________________________________  EKG  ED ECG REPORT I, Arta Silence, the attending physician, personally viewed and interpreted this ECG.  Date: 04/18/2019 EKG Time: 1149 Rate: 85 Rhythm: normal sinus rhythm QRS Axis: normal Intervals: normal ST/T Wave abnormalities: Nonspecific T wave  abnormalities inferior and lateral Narrative Interpretation: no evidence of acute ischemia; no significant change when compared to EKG of 01/25/2018  ____________________________________________  RADIOLOGY  CT head: No acute abnormality CT cervical spine: No acute fracture  ____________________________________________   PROCEDURES  Procedure(s) performed: No  Procedures  Critical Care performed: No ____________________________________________   INITIAL IMPRESSION / ASSESSMENT AND PLAN / ED COURSE  Pertinent labs & imaging results that were available during my care of the patient were reviewed by me and considered in my medical decision making (see chart for details).  59 year old female with PMH as noted above including cerebral palsy, hypertension, and hyperlipidemia presents with lightheadedness today as well as a near syncopal event which caused her to fall and hit her head.  On exam, the patient is relatively well-appearing.  Her vital signs are normal.  Neurologic exam is nonfocal.  The remainder of the physical exam is unremarkable.  EKG shows no acute abnormalities.  Lab work-up reveals hyponatremia.  Differential includes lightheadedness and near syncope directly related to this versus dehydration, hypovolemia, or vasovagal near syncope.  There is no evidence of cardiac etiology.  CT head and cervical spine are negative for acute traumatic findings.  We will give IV fluids, recheck BMP, and reassess.  I anticipate discharge home if the sodium improves after fluids.  ----------------------------------------- 11:27 PM on 04/18/2019 -----------------------------------------  Patient is pending repeat sodium level after fluid bolus.  I signed her out to the oncoming physician Dr. Alfred Levins.  ____________________________________________   FINAL CLINICAL IMPRESSION(S) / ED DIAGNOSES  Final diagnoses:  Near syncope  Hyponatremia      NEW MEDICATIONS STARTED  DURING THIS VISIT:  New Prescriptions   No medications on file     Note:  This document was prepared using Dragon voice recognition software and may include unintentional dictation errors.   Arta Silence, MD 04/18/19 2328

## 2019-04-19 DIAGNOSIS — R55 Syncope and collapse: Secondary | ICD-10-CM | POA: Diagnosis not present

## 2019-04-19 LAB — BASIC METABOLIC PANEL
Anion gap: 12 (ref 5–15)
BUN: 9 mg/dL (ref 6–20)
CO2: 28 mmol/L (ref 22–32)
Calcium: 8.7 mg/dL — ABNORMAL LOW (ref 8.9–10.3)
Chloride: 85 mmol/L — ABNORMAL LOW (ref 98–111)
Creatinine, Ser: 0.74 mg/dL (ref 0.44–1.00)
GFR calc Af Amer: >60 mL/min (ref >60)
GFR calc non Af Amer: >60 mL/min (ref >60)
Glucose, Bld: 292 mg/dL — ABNORMAL HIGH (ref 70–99)
Potassium: 3.2 mmol/L — ABNORMAL LOW (ref 3.5–5.1)
Sodium: 125 mmol/L — ABNORMAL LOW (ref 135–145)

## 2019-04-19 MED ORDER — SODIUM CHLORIDE 0.9 % IV BOLUS
500.0000 mL | Freq: Once | INTRAVENOUS | Status: AC
  Administered 2019-04-19: 01:00:00 500 mL via INTRAVENOUS

## 2019-04-19 MED ORDER — SODIUM CHLORIDE 1 G PO TABS
1.0000 g | ORAL_TABLET | Freq: Once | ORAL | Status: AC
  Administered 2019-04-19: 02:00:00 1 g via ORAL
  Filled 2019-04-19: qty 1

## 2019-04-19 NOTE — ED Provider Notes (Signed)
-----------------------------------------   11:24 PM on 04/18/2019 -----------------------------------------   Blood pressure (!) 142/52, pulse 96, temperature 98.1 F (36.7 C), temperature source Oral, resp. rate 18, height 5\' 4"  (1.626 m), weight 99.8 kg, SpO2 98 %.  Assuming care from Dr. of Rachel [name] is a 59 y.o. female with a chief complaint of Dizziness .    Please refer to H&P by previous MD for further details.  The current plan of care is to f/u repeat BMP after IVF.     _________________________ 1:24 AM on 04/19/2019 -----------------------------------------  Na not significantly unchanged. Review of epic shows that patient is currently at her baseline (Na 124-127 in all blood draws done in 2020). I gave her an extra 500cc bolus and 1g of salt tab prior to discharge. Patient remained at her baseline. Discussed standard return precautions and close follow up with her doctor.     2021, Don Perking, MD 04/19/19 [phone]

## 2019-04-19 NOTE — ED Notes (Signed)
Called pt's legal guardian listed in her chart got an automated service and was unable to leave a message

## 2019-04-20 LAB — POCT PREGNANCY, URINE: Preg Test, Ur: NEGATIVE

## 2019-05-25 ENCOUNTER — Ambulatory Visit
Admission: RE | Admit: 2019-05-25 | Discharge: 2019-05-25 | Disposition: A | Discharge status: Home / Self Care | Payer: Medicare Other | Source: Ambulatory Visit | Attending: Internal Medicine | Admitting: Internal Medicine

## 2019-05-25 DIAGNOSIS — Z1231 Encounter for screening mammogram for malignant neoplasm of breast: Secondary | ICD-10-CM

## 2019-06-01 ENCOUNTER — Ambulatory Visit: Payer: Medicare Other | Admitting: Podiatry

## 2019-06-11 ENCOUNTER — Ambulatory Visit (INDEPENDENT_AMBULATORY_CARE_PROVIDER_SITE_OTHER): Payer: Medicare Other | Admitting: Podiatry

## 2019-06-11 ENCOUNTER — Encounter: Payer: Self-pay | Admitting: Podiatry

## 2019-06-11 ENCOUNTER — Other Ambulatory Visit: Payer: Self-pay

## 2019-06-11 VITALS — Temp 97.3°F

## 2019-06-11 DIAGNOSIS — M79674 Pain in right toe(s): Secondary | ICD-10-CM

## 2019-06-11 DIAGNOSIS — E119 Type 2 diabetes mellitus without complications: Secondary | ICD-10-CM

## 2019-06-11 DIAGNOSIS — M79675 Pain in left toe(s): Secondary | ICD-10-CM

## 2019-06-11 DIAGNOSIS — B351 Tinea unguium: Secondary | ICD-10-CM

## 2019-06-11 NOTE — Progress Notes (Signed)
This patient returns to my office for at risk foot care.  This patient requires this care by a professional since this patient will be at risk due to having CP and diabetes and neuropathy.This patient is unable to cut nails themselves since the patient cannot reach their nails.These nails are painful walking and wearing shoes.  This patient presents for at risk foot care today.  General Appearance  Alert, conversant and in no acute stress.  Vascular  Dorsalis pedis and posterior tibial  pulses are palpable  bilaterally.  Capillary return is within normal limits  bilaterally. Temperature is within normal limits  bilaterally.  Neurologic  Senn-Weinstein monofilament wire test diminished bilaterally. Muscle power within normal limits bilaterally.  Nails Thick disfigured discolored nails with subungual debris  from hallux to three  toes bilaterally. No evidence of bacterial infection or drainage bilaterally.  Orthopedic  No limitations of motion  feet .  No crepitus or effusions noted.  No bony pathology or digital deformities noted.  Skin  normotropic skin with no porokeratosis noted bilaterally.  No signs of infections or ulcers noted.     Onychomycosis  Pain in right toes  Pain in left toes  Consent was obtained for treatment procedures.   Mechanical debridement of nails 1-3  bilaterally performed with a nail nipper.  Filed with dremel without incident. No infection or ulcer.     Return office visit  4 months         Told patient to return for periodic foot care and evaluation due to potential at risk complications.   Helane Gunther DPM

## 2019-07-11 ENCOUNTER — Emergency Department: Payer: Medicare Other

## 2019-07-11 ENCOUNTER — Other Ambulatory Visit: Payer: Self-pay

## 2019-07-11 ENCOUNTER — Encounter: Payer: Self-pay | Admitting: Emergency Medicine

## 2019-07-11 ENCOUNTER — Emergency Department
Admission: EM | Admit: 2019-07-11 | Discharge: 2019-07-11 | Disposition: A | Discharge status: Home / Self Care | Payer: Medicare Other | Attending: Emergency Medicine | Admitting: Emergency Medicine

## 2019-07-11 DIAGNOSIS — R42 Dizziness and giddiness: Secondary | ICD-10-CM | POA: Diagnosis not present

## 2019-07-11 DIAGNOSIS — G809 Cerebral palsy, unspecified: Secondary | ICD-10-CM | POA: Diagnosis not present

## 2019-07-11 DIAGNOSIS — E119 Type 2 diabetes mellitus without complications: Secondary | ICD-10-CM | POA: Diagnosis not present

## 2019-07-11 DIAGNOSIS — R112 Nausea with vomiting, unspecified: Secondary | ICD-10-CM | POA: Diagnosis not present

## 2019-07-11 DIAGNOSIS — Y939 Activity, unspecified: Secondary | ICD-10-CM | POA: Insufficient documentation

## 2019-07-11 DIAGNOSIS — Z79899 Other long term (current) drug therapy: Secondary | ICD-10-CM | POA: Insufficient documentation

## 2019-07-11 DIAGNOSIS — S22080A Wedge compression fracture of T11-T12 vertebra, initial encounter for closed fracture: Secondary | ICD-10-CM | POA: Diagnosis not present

## 2019-07-11 DIAGNOSIS — X58XXXA Exposure to other specified factors, initial encounter: Secondary | ICD-10-CM | POA: Diagnosis not present

## 2019-07-11 DIAGNOSIS — Y929 Unspecified place or not applicable: Secondary | ICD-10-CM | POA: Insufficient documentation

## 2019-07-11 DIAGNOSIS — Z794 Long term (current) use of insulin: Secondary | ICD-10-CM | POA: Diagnosis not present

## 2019-07-11 DIAGNOSIS — Y999 Unspecified external cause status: Secondary | ICD-10-CM | POA: Diagnosis not present

## 2019-07-11 DIAGNOSIS — I10 Essential (primary) hypertension: Secondary | ICD-10-CM | POA: Insufficient documentation

## 2019-07-11 DIAGNOSIS — R1030 Lower abdominal pain, unspecified: Secondary | ICD-10-CM | POA: Insufficient documentation

## 2019-07-11 LAB — HEPATIC FUNCTION PANEL
ALT: 19 U/L (ref 0–44)
AST: 23 U/L (ref 15–41)
Albumin: 4.2 g/dL (ref 3.5–5.0)
Alkaline Phosphatase: 111 U/L (ref 38–126)
Bilirubin, Direct: 0.2 mg/dL (ref 0.0–0.2)
Indirect Bilirubin: 0.4 mg/dL (ref 0.3–0.9)
Total Bilirubin: 0.6 mg/dL (ref 0.3–1.2)
Total Protein: 8.3 g/dL — ABNORMAL HIGH (ref 6.5–8.1)

## 2019-07-11 LAB — URINALYSIS, COMPLETE (UACMP) WITH MICROSCOPIC
Bacteria, UA: NONE SEEN
Bilirubin Urine: NEGATIVE
Glucose, UA: 50 mg/dL — AB
Hgb urine dipstick: NEGATIVE
Ketones, ur: NEGATIVE mg/dL
Leukocytes,Ua: NEGATIVE
Nitrite: NEGATIVE
Protein, ur: NEGATIVE mg/dL
Specific Gravity, Urine: 1.01 (ref 1.005–1.030)
pH: 7 (ref 5.0–8.0)

## 2019-07-11 LAB — CBC
HCT: 36.6 % (ref 36.0–46.0)
Hemoglobin: 12 g/dL (ref 12.0–15.0)
MCH: 29.9 pg (ref 26.0–34.0)
MCHC: 32.8 g/dL (ref 30.0–36.0)
MCV: 91 fL (ref 80.0–100.0)
Platelets: 277 10*3/uL (ref 150–400)
RBC: 4.02 MIL/uL (ref 3.87–5.11)
RDW: 12.1 % (ref 11.5–15.5)
WBC: 5.3 10*3/uL (ref 4.0–10.5)
nRBC: 0 % (ref 0.0–0.2)

## 2019-07-11 LAB — BASIC METABOLIC PANEL
Anion gap: 15 (ref 5–15)
BUN: 8 mg/dL (ref 6–20)
CO2: 25 mmol/L (ref 22–32)
Calcium: 9.2 mg/dL (ref 8.9–10.3)
Chloride: 87 mmol/L — ABNORMAL LOW (ref 98–111)
Creatinine, Ser: 0.67 mg/dL (ref 0.44–1.00)
GFR calc Af Amer: >60 mL/min (ref >60)
GFR calc non Af Amer: >60 mL/min (ref >60)
Glucose, Bld: 176 mg/dL — ABNORMAL HIGH (ref 70–99)
Potassium: 3.5 mmol/L (ref 3.5–5.1)
Sodium: 127 mmol/L — ABNORMAL LOW (ref 135–145)

## 2019-07-11 LAB — LIPASE, BLOOD: Lipase: 16 U/L (ref 11–51)

## 2019-07-11 LAB — POC URINE PREG, ED: Preg Test, Ur: NEGATIVE

## 2019-07-11 MED ORDER — ONDANSETRON HCL 4 MG/2ML IJ SOLN: 4.0000 mg | Freq: Once | INTRAMUSCULAR | Status: DC

## 2019-07-11 MED ORDER — IOHEXOL 300 MG/ML  SOLN
100.0000 mL | Freq: Once | INTRAMUSCULAR | Status: AC | PRN
  Administered 2019-07-11: 14:00:00 100 mL via INTRAVENOUS

## 2019-07-11 MED ORDER — ONDANSETRON HCL 4 MG/2ML IJ SOLN
4.0000 mg | Freq: Once | INTRAMUSCULAR | Status: AC
  Administered 2019-07-11: 4 mg via INTRAVENOUS
  Filled 2019-07-11: qty 2

## 2019-07-11 MED ORDER — IOHEXOL 9 MG/ML PO SOLN: 500.0000 mL | Freq: Two times a day (BID) | ORAL | Status: DC | PRN

## 2019-07-11 MED ORDER — SODIUM CHLORIDE 0.9 % IV BOLUS
500.0000 mL | Freq: Once | INTRAVENOUS | Status: AC
  Administered 2019-07-11: 10:00:00 500 mL via INTRAVENOUS

## 2019-07-11 NOTE — ED Triage Notes (Signed)
Pt to ER via EMS from group home.  EMS reports pt was getting in shower and got lightheaded.  Pt also reported SHOB to EMS in route.  Pt nauseous in triage.  EMS reports they have responded to pt for same c/o in past, but pt does not usually come to ER.

## 2019-07-11 NOTE — ED Provider Notes (Addendum)
[facility] Emergency Department Provider Note   ____________________________________________   First MD Initiated Contact with Patient 07/11/19 [phone]     (approximate)  I have reviewed the triage vital signs and the nursing notes.   HISTORY  Chief Complaint Dizziness  EM caveat some limitation due to cognitive status, her caretaker from her home is also with her and helps with some of the history  HPI Rachel [name] is a 59 y.o. female here for evaluation of feeling lightheaded this morning   Patient reports she got up this morning when she is getting ready to use the bathroom she started to feel lightheaded.  She felt a little nauseated.  She has had some lower abdominal pain as well she did vomit once this morning.  She denies any chest pain or trouble breathing.  She reports that she has however noticed that it hurts when she pees for the last couple days.  She did vomit once last night as well.  She denies fevers.  Caretaker denies that the patient is had any fever but she did throw up once here.  She has been behaving and acting to her normal baseline, and has stuttering speech at baseline.  She has cerebral palsy.  She is taking her medications this morning has a long history of high blood pressure  Past Medical History:  Diagnosis Date  . Cerebral palsy ([place])   . Diabetes mellitus without complication ([place])   . Hypercholesteremia   . Hypertension     Patient Active Problem List   Diagnosis Date Noted  . Pain due to onychomycosis of toenails of both feet 02/02/2019  . Acute gastroenteritis 01/26/2018  . 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 Aggression 07/13/2015  . Palpitations 07/06/2015  . Essential hypertension, benign 07/06/2015  . Obesity 07/06/2015  . Cerebral palsy ([place]) 12/24/2013  . Controlled type 2 diabetes mellitus without complication ([place]) 36/14/4315  . HLD (hyperlipidemia) 12/24/2013  . BP (high blood pressure)  12/24/2013  . Adiposity 12/24/2013  . Peripheral nerve disease 12/24/2013    Past Surgical History:  Procedure Laterality Date  . DILATION AND CURETTAGE OF UTERUS      Prior to Admission medications   Medication Sig Start Date End Date Taking? Authorizing Provider  amLODipine (NORVASC) 10 MG tablet Take 10 mg by mouth daily.     [provider]  ARIPiprazole (ABILIFY) 20 MG tablet Take 20 mg by mouth daily.     [provider]  carbamazepine (TEGRETOL) 200 MG tablet Take 400 mg by mouth 2 (two) times daily.     [provider]  chlorthalidone (HYGROTON) 50 MG tablet Take 50 mg by mouth every other day.    [provider]  fluticasone (FLONASE) 50 MCG/ACT nasal spray Place 1 spray into both nostrils daily.    [provider]  furosemide (LASIX) 40 MG tablet Take 40 mg by mouth daily as needed for edema.     [provider]  glipiZIDE (GLUCOTROL XL) 10 MG 24 hr tablet Take 10 mg by mouth daily with breakfast.  09/20/14   [provider]  hydrALAZINE (APRESOLINE) 50 MG tablet Take 100 mg by mouth 3 (three) times daily.     [provider]  Insulin Glargine (LANTUS SOLOSTAR) 100 UNIT/ML Solostar Pen Inject 30 Units into the skin at bedtime.     [provider]  loratadine (CLARITIN) 10 MG tablet Take 10 mg by mouth daily.  09/21/14   [provider]  losartan (COZAAR) 100 MG tablet Take 100 mg by mouth daily.  01/04/15 02/25/18  [provider]  magnesium oxide (MAG-OX) 400 MG tablet Take 400 mg by mouth daily.    [provider]  medroxyPROGESTERone (DEPO-PROVERA) 150 MG/ML injection Inject 150 mg into the muscle every 3 (three) months.    [provider]  metFORMIN (GLUCOPHAGE) 1000 MG tablet Take 1,000 mg by mouth 2 (two) times daily with a meal.  02/21/15   [provider]  Multiple Vitamin (MULTIVITAMIN WITH MINERALS) TABS tablet Take 1 tablet by mouth daily.     [provider]  ondansetron (ZOFRAN) 4 MG tablet Take 1 tablet (4 mg total) by mouth every 6 (six) hours as needed for nausea. 01/27/18   Mayo, Allyn Kenner, MD  pioglitazone (ACTOS) 15 MG tablet Take 15 mg by mouth daily.     [provider]  polyethylene glycol (MIRALAX / GLYCOLAX) packet Take 17 g by mouth daily as needed for moderate constipation.    [provider]  potassium chloride SA (K-DUR,KLOR-CON) 20 MEQ tablet Take 20 mEq by mouth 2 (two) times daily.  04/13/14   [provider]  pravastatin (PRAVACHOL) 80 MG tablet Take 80 mg by mouth at bedtime.     [provider]  prazosin (MINIPRESS) 2 MG capsule Take 2 mg by mouth 2 (two) times daily.  09/20/14   [provider]  ranitidine (ZANTAC) 150 MG tablet Take 150 mg by mouth 2 (two) times daily.    [provider]  senna-docusate (SENOKOT-S) 8.6-50 MG tablet Take 1 tablet by mouth 2 (two) times daily.    [provider]  traZODone (DESYREL) 50 MG tablet Take 50 mg by mouth at bedtime.    [provider]    Allergies Ace inhibitors  Family History  Problem Relation Age of Onset  . Breast cancer Neg Hx     Social History Social History   Tobacco Use  . Smoking status: Never Smoker  . Smokeless tobacco: Never Used  Substance Use Topics  . Alcohol use: No    Alcohol/week: 0.0 standard drinks  . Drug use: No    Review of Systems Constitutional: No fever/chills but feeling a lightheaded especially when she got up to use the bathroom today.  She did not fall or pass out. Eyes: No visual changes. ENT: No sore throat. Cardiovascular: Denies chest pain. Respiratory: Denies shortness of breath. Gastrointestinal: No abdominal pain.  Does however report that she was nauseated and threw up once this morning and also hurts when she urinates Genitourinary: Painful and slightly dark urination for the last day Musculoskeletal: Negative for back pain. Skin:  Negative for rash. Neurological: Negative for areas of focal weakness or numbness.  She does report a mild headache.  Difficult for her to describe it.    ____________________________________________   PHYSICAL EXAM:  VITAL SIGNS: ED Triage Vitals  Enc Vitals Group     BP 07/11/19 0827 136/64     Pulse Rate 07/11/19 0827 (!) 109     Resp 07/11/19 0827 18     Temp 07/11/19 0827 98.7 F (37.1 C)     Temp Source 07/11/19 0827 Oral     SpO2 07/11/19 0827 98 %     Weight 07/11/19 0828 220 lb 0.3 oz (99.8 kg)     Height 07/11/19 0828 5\' 7"  (1.702 m)     Head Circumference --      Peak Flow --  Pain Score 07/11/19 0827 8     Pain Loc --      Pain Edu? --      Excl. in GC? --     Constitutional: Alert and oriented. Well appearing and in no acute distress.  She is pleasant. Eyes: Conjunctivae are normal. Head: Atraumatic. Nose: No congestion/rhinnorhea. Mouth/Throat: Mucous membranes are moist. Neck: No stridor.  Cardiovascular: Minimally tachycardic rate, regular rhythm. Grossly normal heart sounds.  Good peripheral circulation. Respiratory: Normal respiratory effort.  No retractions. Lungs CTAB. Gastrointestinal: Soft and nontender except she does report some tenderness to palpation in the lower abdomen's quadrants bilaterally without rebound or guarding. No distention. Musculoskeletal: No lower extremity tenderness nor edema. Neurologic:  Normal speech and language. No gross focal neurologic deficits are appreciated.  Skin:  Skin is warm, dry and intact. No rash noted. Psychiatric: Mood and affect are normal. Speech and behavior are normal, except she does have some stuttering of her speech which is reported as normal by her group worker..  ____________________________________________   LABS (all labs ordered are listed, but only abnormal results are displayed)  Labs Reviewed  BASIC METABOLIC PANEL - Abnormal; Notable for the following components:      Result Value    Sodium 127 (*)    Chloride 87 (*)    Glucose, Bld 176 (*)    All other components within normal limits  URINALYSIS, COMPLETE (UACMP) WITH MICROSCOPIC - Abnormal; Notable for the following components:   Color, Urine YELLOW (*)    APPearance CLEAR (*)    Glucose, UA 50 (*)    All other components within normal limits  HEPATIC FUNCTION PANEL - Abnormal; Notable for the following components:   Total Protein 8.3 (*)    All other components within normal limits  CBC  LIPASE, BLOOD  POC URINE PREG, ED   ____________________________________________  EKG  Reviewed her by me at 840 Heart rate 110 QRS 90 QTc 430 Some baseline artifact, meets interpretation slightly difficult.  Question if sinus tachycardia versus slight atrial fibrillation though quite regular.  Could represent flutter. ____________________________________________  RADIOLOGY  CT Head Wo Contrast  Result Date: 07/11/2019 CLINICAL DATA:  Headache.  Lightheadedness. EXAM: CT HEAD WITHOUT CONTRAST TECHNIQUE: Contiguous axial images were obtained from the base of the skull through the vertex without intravenous contrast. COMPARISON:  April 18, 2019 FINDINGS: Brain: No evidence of acute infarction, hemorrhage, hydrocephalus, extra-axial collection or mass lesion/mass effect. Vascular: Mild calcified atherosclerosis in the intracranial carotids. Skull: Normal. Negative for fracture or focal lesion. Sinuses/Orbits: Opacification of right-sided ethmoid air cells. Other: None. IMPRESSION: 1. Opacification of scattered right ethmoid air cells. No other acute abnormalities. Electronically Signed   By: Gerome Samavid  Williams III M.D   On: 07/11/2019 14:51   CT ABDOMEN PELVIS W CONTRAST  Result Date: 07/11/2019 CLINICAL DATA:  Lower abdominal pain, nausea, vomiting EXAM: CT ABDOMEN AND PELVIS WITH CONTRAST TECHNIQUE: Multidetector CT imaging of the abdomen and pelvis was performed using the standard protocol following bolus administration of  intravenous contrast. CONTRAST:  100mL OMNIPAQUE IOHEXOL 300 MG/ML  SOLN COMPARISON:  01/25/2018 FINDINGS: Lower chest: Mild cardiomegaly, unchanged. No acute abnormality. Hepatobiliary: Decreased attenuation of the hepatic parenchyma suggestive of hepatic steatosis. No focal liver lesion. Cholecystectomy. No biliary dilatation. Pancreas: Unremarkable. No pancreatic ductal dilatation or surrounding inflammatory changes. Spleen: Normal in size without focal abnormality. Adrenals/Urinary Tract: Adrenal glands are unremarkable. Kidneys are normal, without renal calculi, focal lesion, or hydronephrosis. Bladder is unremarkable. Stomach/Bowel: Small hiatal hernia.  Stomach is otherwise within normal limits. Appendix appears normal (series 3, image 45). No evidence of bowel wall thickening, distention, or inflammatory changes. Mild-to-moderate volume of stool within the rectosigmoid colon. Vascular/Lymphatic: Scattered atherosclerotic calcifications. No aneurysm. No abdominopelvic lymphadenopathy. Reproductive: Uterus and bilateral adnexa are unremarkable. Other: Tiny fat containing umbilical hernia. No abdominopelvic ascites. Musculoskeletal: Mild superior endplate compression deformity of T12, new from prior CT 2019. There appears to be a acute to subacute component (series 6, images 62-64). Approximately 15% vertebral body height loss without bony retropulsion. IMPRESSION: 1. No acute intra-abdominopelvic findings. 2. Mild superior endplate compression deformity of T12, new from prior CT 2019, likely acute to subacute. Approximately 15% vertebral body height loss without bony retropulsion. 3. Hepatic steatosis. 4. Small hiatal hernia. Electronically Signed   By: Duanne Guess D.O.   On: 07/11/2019 14:55    CT abdomen pelvis negative for acute findings.  Noted for 15% vertebral body height loss compression deformity of T12.  However, discussed this with the patient and caretaker and she not complaining of any back  pain or neurologic symptoms.  Suspect this is likely unrelated to today's presentation but did inform them and they can follow-up with primary care regarding this CT the head negative for acute findings except for some opacification of ethmoid air cells ____________________________________________   PROCEDURES  Procedure(s) performed: 3-lead  .1-3 Lead EKG Interpretation Performed by: Sharyn Creamer, MD Authorized by: Sharyn Creamer, MD     Interpretation: normal     ECG rate:  100   ECG rate assessment: normal     Rhythm: sinus tachycardia     Ectopy: none     Conduction: normal      Critical Care performed: No  ____________________________________________   INITIAL IMPRESSION / ASSESSMENT AND PLAN / ED COURSE  Pertinent labs & imaging results that were available during my care of the patient were reviewed by me and considered in my medical decision making (see chart for details).   Patient was for evaluation after an episode of feeling lightheaded.  She describes feeling lightheaded when she got up to use the bathroom.  The symptoms have improved, however she does report that she started having some pain with urination over the last day or 2.  No fevers though.  No chest pain.  No trouble breathing.  On 3-lead monitoring, the patient is clearly noted to be in sinus rhythm, and not flutter or A. Fib.  Lab work demonstrates normal CBC and stable chemistry with normal glucose and mild hyponatremia which appears to be chronic in nature.  We will check urinalysis.  Also provide antiemetic as she does report some nausea.  Clinical Course as of Jul 11 1554  Sat Jul 11, 2019  1344 Patient reassessed, needs to urinate otherwise she is still having some discomfort in her lower abdomen. She did vomit 1 additional time after drinking 1 bottle contrast. We'll proceed with CT now and provide Zofran.   [MQ]    Clinical Course User Index [MQ] Sharyn Creamer, MD    ----------------------------------------- 3:56 PM on 07/11/2019 -----------------------------------------  Patient doing well, tolerating by mouth.  No acute distress.  Caretaker at the bedside.  She has as needed Zofran prescription at home already, they are comfortable with plan to return to her care home.  Careful return precautions advised.  At this point suspect some type of self-limited gastrointestinal etiology for her symptoms.  Her work-up in the ER very reassuring.  No cardiopulmonary symptoms.  Return precautions and  treatment recommendations and follow-up discussed with the patient and her caretaker who is agreeable with the plan.  Vitals:   07/11/19 1430 07/11/19 1500  BP: (!) 166/71 (!) 158/69  Pulse: 89 80  Resp: (!) 25 (!) 22  Temp:    SpO2: 97% 96%    ____________________________________________   FINAL CLINICAL IMPRESSION(S) / ED DIAGNOSES  Final diagnoses:  Non-intractable vomiting with nausea, unspecified vomiting type  Dizziness  T12 compression fracture, initial encounter [facility])        Note:  This document was prepared using Dragon voice recognition software and may include unintentional dictation errors       Sharyn Creamer, MD 07/11/19 1558    Sharyn Creamer, MD 07/11/19 1601

## 2019-07-11 NOTE — ED Notes (Signed)
E-signature not working at this time. Pt and caregiver verbalized understanding of D/C instructions, prescriptions and follow up care with no further questions at this time. Pt in NAD and ambulatory at time of D/C. Pt legal guardian not answering when this RN attempted to call and inform of pt pending D/C.

## 2019-07-11 NOTE — ED Notes (Signed)
This rn attempted to contact legal guardian with no answer. Group home employee at bedside with pt.

## 2019-07-11 NOTE — ED Provider Notes (Signed)
Patient resides at [facility]. Guardian phone is not answered but is Act[employer] with [name]. Patient has caretaker from [name] with her at ER   Rachel Creamer, MD 07/11/19 1600

## 2019-07-11 NOTE — Discharge Instructions (Signed)
Utilize your previously prescribed ondansetron prescription for nausea as needed (as prescribed)  Please follow-up with Dr. Laural Benes.    Additionally, on your CT scan we did note that there is a minor compression fracture at the 12 thoracic vertebrae.  Please follow-up with Dr. Laural Benes on this as well.  Should you start to develop symptoms such as pain in your back, numbness tingling or weakness in your legs please return to the ER for evaluation though at this point I do not think you are having any symptoms related to that

## 2019-07-31 IMAGING — MG MM DIGITAL SCREENING BILAT W/ CAD
4 series · 4 of 4 positions shown · non-contrast
Comparison: Previous exam(s).

CLINICAL DATA: Screening.

EXAM:
DIGITAL SCREENING BILATERAL MAMMOGRAM WITH CAD

[R CC]
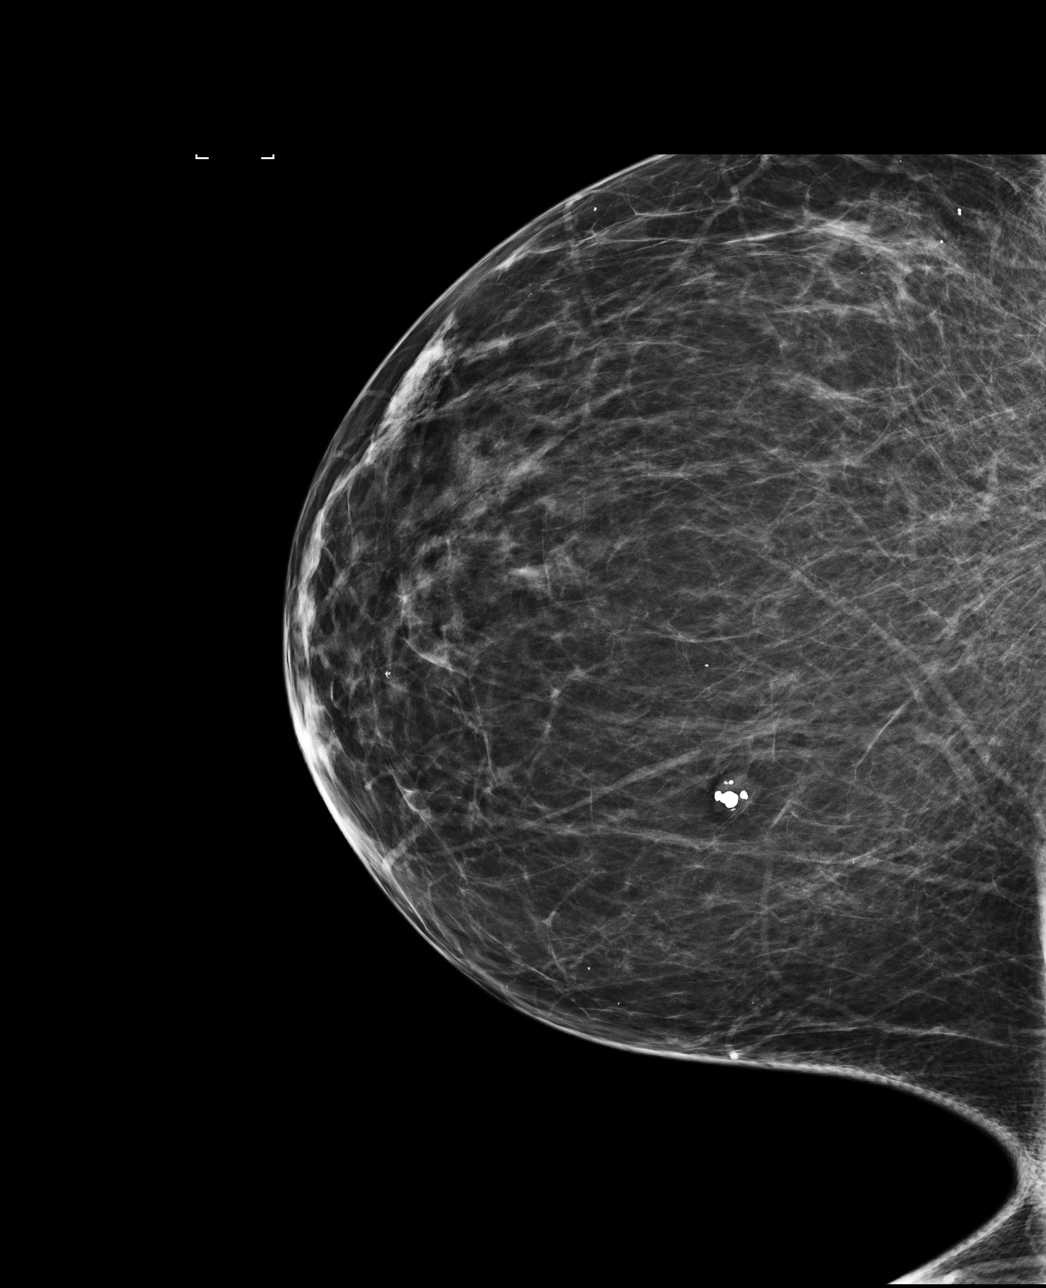

[L CC]
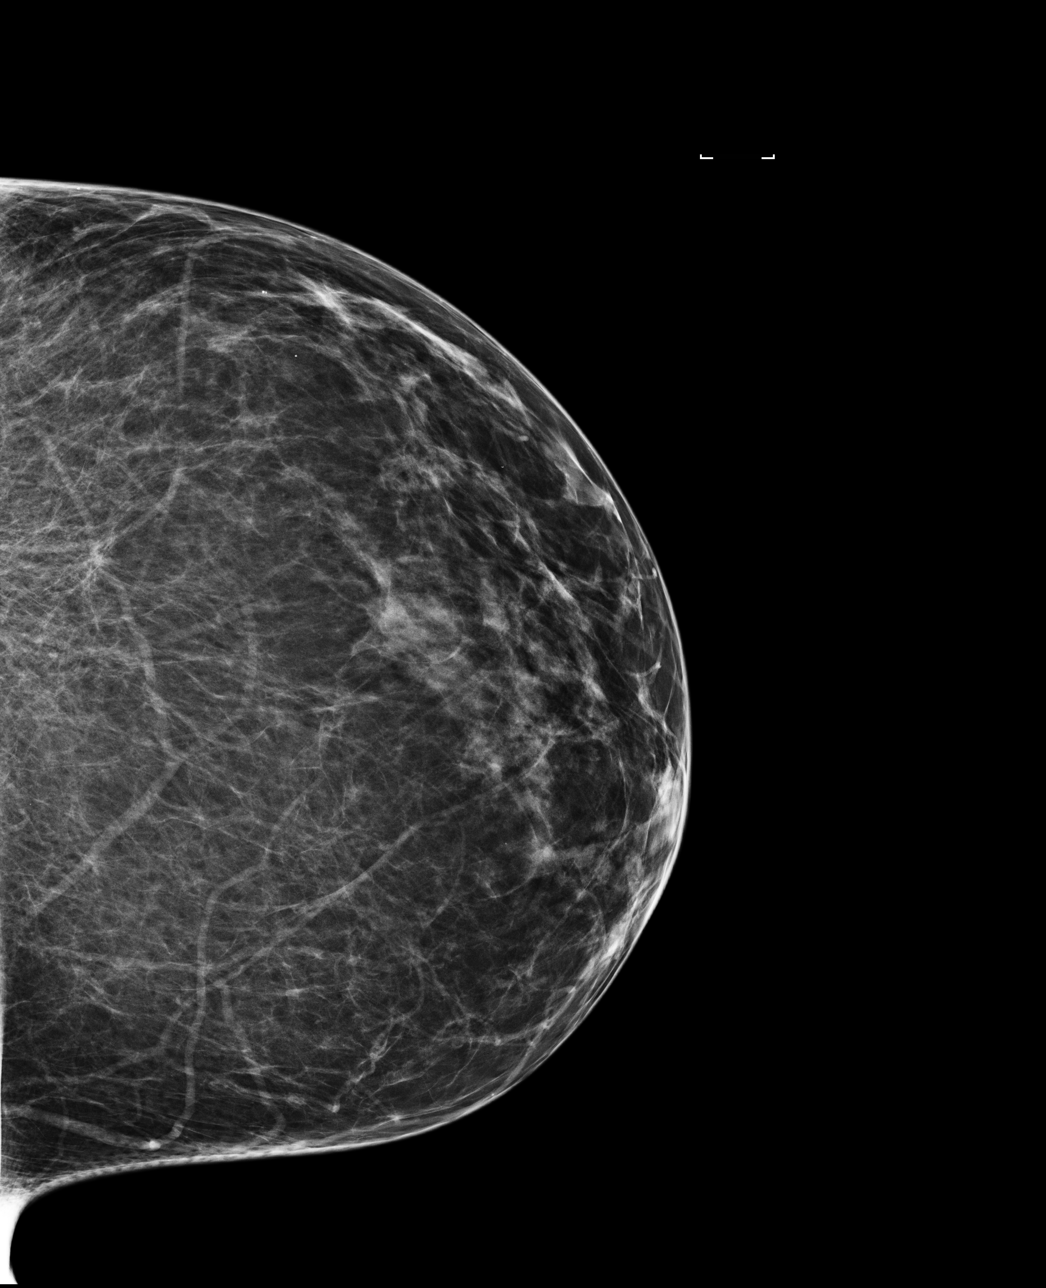

[R MLO]
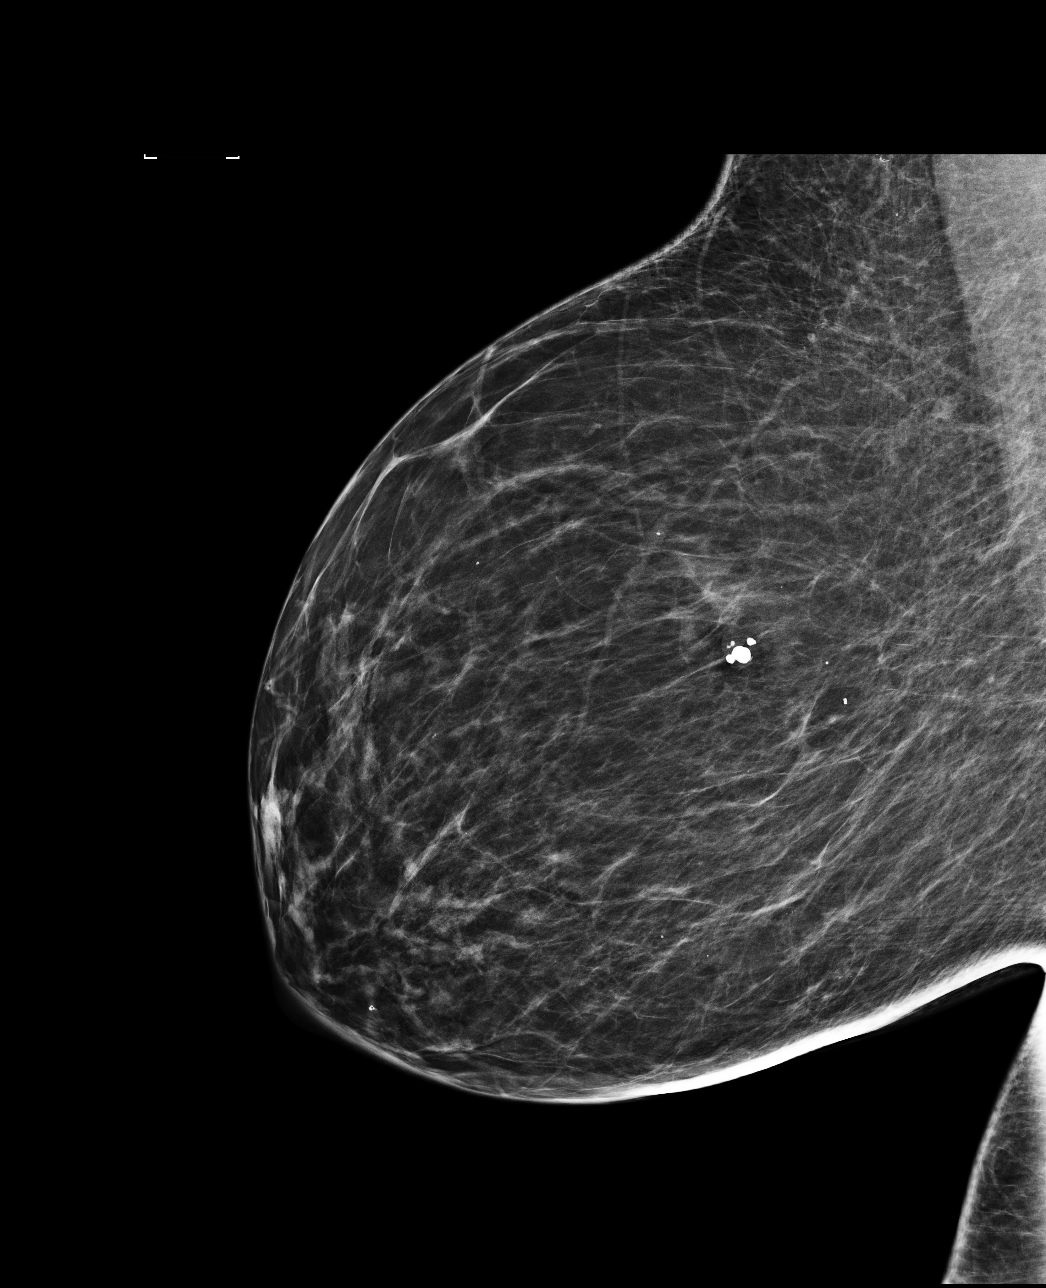

[L MLO]
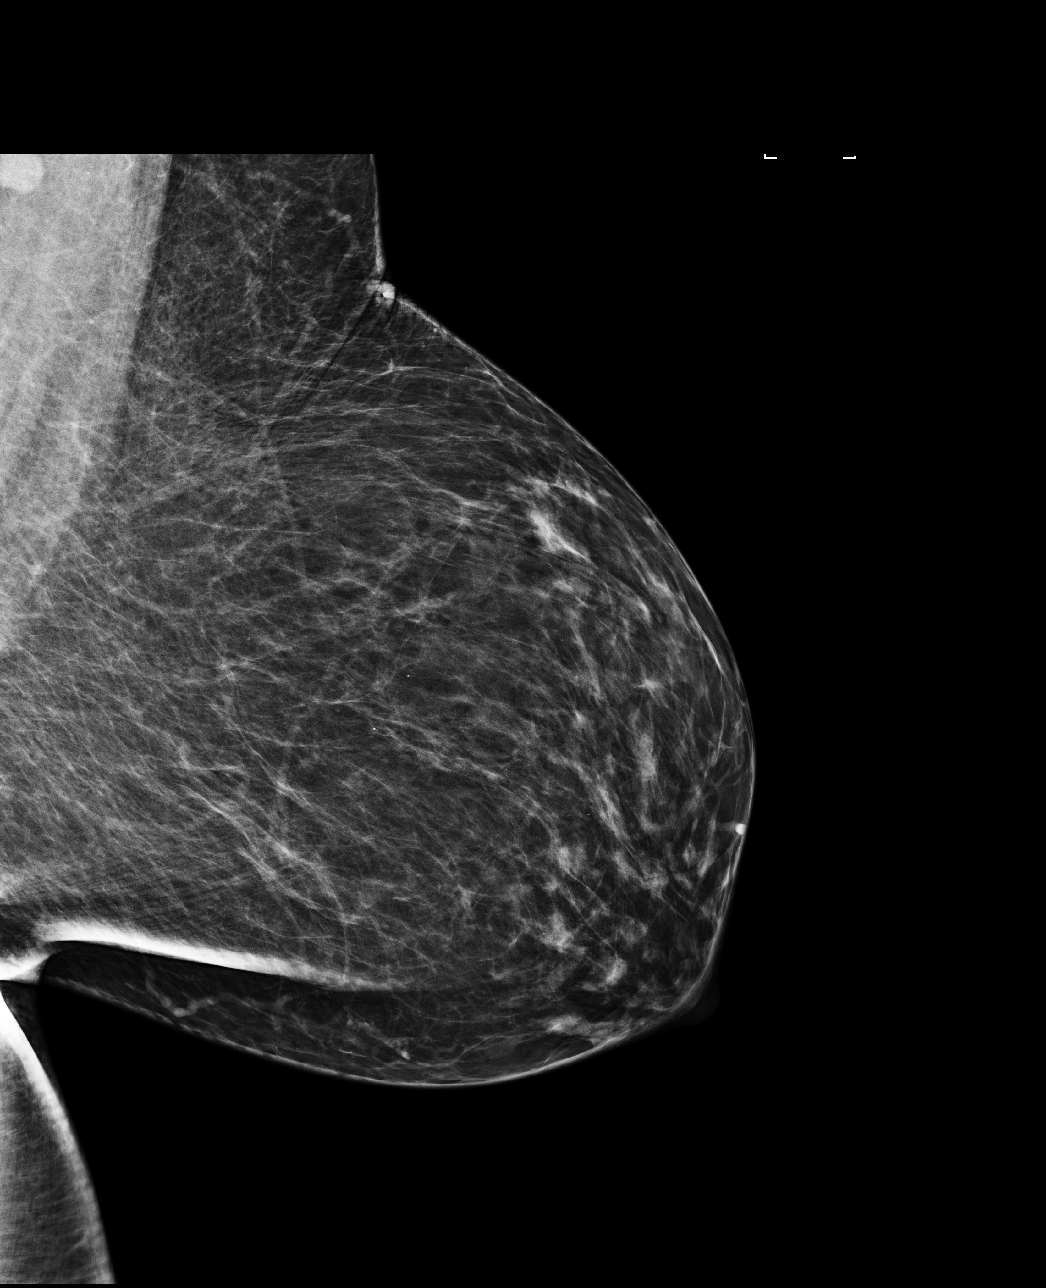

[4 of 4 positions shown; findings below may reference images not displayed]

ACR Breast Density Category b: There are scattered areas of
fibroglandular density.
FINDINGS: There are no findings suspicious for malignancy. Images were
processed with CAD.
IMPRESSION: No mammographic evidence of malignancy. A result letter of this
screening mammogram will be mailed directly to the patient.

RECOMMENDATION:
Screening mammogram in one year. (Code:AS-G-LCT)

BI-RADS CATEGORY  1: Negative.

## 2019-10-15 ENCOUNTER — Encounter: Payer: Self-pay | Admitting: Podiatry

## 2019-10-15 ENCOUNTER — Other Ambulatory Visit: Payer: Self-pay

## 2019-10-15 ENCOUNTER — Ambulatory Visit (INDEPENDENT_AMBULATORY_CARE_PROVIDER_SITE_OTHER): Payer: Medicare Other | Admitting: Podiatry

## 2019-10-15 DIAGNOSIS — B351 Tinea unguium: Secondary | ICD-10-CM | POA: Diagnosis not present

## 2019-10-15 DIAGNOSIS — E119 Type 2 diabetes mellitus without complications: Secondary | ICD-10-CM | POA: Diagnosis not present

## 2019-10-15 DIAGNOSIS — M205X2 Other deformities of toe(s) (acquired), left foot: Secondary | ICD-10-CM

## 2019-10-15 DIAGNOSIS — M79674 Pain in right toe(s): Secondary | ICD-10-CM

## 2019-10-15 DIAGNOSIS — M79675 Pain in left toe(s): Secondary | ICD-10-CM

## 2019-10-15 DIAGNOSIS — M205X1 Other deformities of toe(s) (acquired), right foot: Secondary | ICD-10-CM

## 2019-10-15 NOTE — Progress Notes (Signed)
This patient returns to my office for at risk foot care.  This patient requires this care by a professional since this patient will be at risk due to having CP and diabetes and neuropathy.This patient is unable to cut nails themselves since the patient cannot reach their nails.These nails are painful walking and wearing shoes.  This patient presents for at risk foot care today.  General Appearance  Alert, conversant and in no acute stress.  Vascular  Dorsalis pedis and posterior tibial  pulses are palpable  bilaterally.  Capillary return is within normal limits  bilaterally. Temperature is within normal limits  bilaterally.  Neurologic  Senn-Weinstein monofilament wire test diminished bilaterally. Muscle power within normal limits bilaterally.  Nails Thick disfigured discolored nails with subungual debris  from hallux to three  toes bilaterally. No evidence of bacterial infection or drainage bilaterally.  Orthopedic  No limitations of motion  feet .  No crepitus or effusions noted.  No bony pathology or digital deformities noted.  Hallux limitus  1st MPJ  B/L.  Skin  normotropic skin with no porokeratosis noted bilaterally.  No signs of infections or ulcers noted.     Onychomycosis  Pain in right toes  Pain in left toes  Consent was obtained for treatment procedures.   Mechanical debridement of nails 1-3  bilaterally performed with a nail nipper.  Filed with dremel without incident. No infection or ulcer.  She qualifies for diabetic shoes.   Return office visit  6 months         Told patient to return for periodic foot care and evaluation due to potential at risk complications.   Ronav Furney DPM  

## 2019-11-04 ENCOUNTER — Other Ambulatory Visit: Payer: Medicare Other | Admitting: Orthotics

## 2019-12-09 ENCOUNTER — Other Ambulatory Visit: Payer: Medicare Other | Admitting: Orthotics

## 2020-01-07 ENCOUNTER — Ambulatory Visit
Admission: EM | Admit: 2020-01-07 | Discharge: 2020-01-07 | Disposition: A | Discharge status: Home / Self Care | Payer: Medicare Other | Attending: Physician Assistant | Admitting: Physician Assistant

## 2020-01-07 ENCOUNTER — Other Ambulatory Visit: Payer: Self-pay

## 2020-01-07 DIAGNOSIS — R519 Headache, unspecified: Secondary | ICD-10-CM | POA: Diagnosis not present

## 2020-01-07 DIAGNOSIS — R112 Nausea with vomiting, unspecified: Secondary | ICD-10-CM

## 2020-01-07 LAB — URINALYSIS, COMPLETE (UACMP) WITH MICROSCOPIC
Bilirubin Urine: NEGATIVE
Glucose, UA: NEGATIVE mg/dL
Hgb urine dipstick: NEGATIVE
Ketones, ur: NEGATIVE mg/dL
Leukocytes,Ua: NEGATIVE
Nitrite: NEGATIVE
Protein, ur: NEGATIVE mg/dL
Specific Gravity, Urine: 1.015 (ref 1.005–1.030)
pH: 7.5 (ref 5.0–8.0)

## 2020-01-07 NOTE — Discharge Instructions (Addendum)
The nausea and vomiting is likely related to Rachel [name]'s headache.  Advised to take Tylenol as needed for headache.  We did not take another Covid test since she just had one.  If she starts to have a fever, cough, sore throat, breathing difficulty or other new or worsening symptoms consider having that done.  A urinalysis was normal today.  She does not appear to have a UTI.  Keep a check on her blood sugars and continue her on her diabetes medication.  Also keep an eye on her blood pressure and keep a log for her PCP.  Blood pressure was elevated today at 152/72.  Rachel [name]'s exam looks good today.  If she has a fever, abdominal pain, intractable vomiting, or any new or worsening symptoms have her seen again in our department or the ED.  She may take the Zofran as needed for her nausea.  Advised rest and increasing her fluid intake.

## 2020-01-07 NOTE — ED Triage Notes (Signed)
Pt reports having n/v that began today. Also reports having a headache. No other symptoms or concerns at this time.

## 2020-01-07 NOTE — ED Provider Notes (Signed)
[facility]    CSN: [id number] Arrival date & time: 01/07/20  1641      History   Chief Complaint Chief Complaint  Patient presents with  . Nausea    HPI Rachel [name] is a 59 y.o. female who presents with her caregiver today for nausea and vomiting this afternoon.  Associated with headaches.  Her caregiver says that she sometimes has nausea and vomiting associated with her headaches.  Patient has a history of cerebral palsy intellectual disability, diabetes and hypertension.  Her caregiver says that they check temperatures every day, blood sugar and blood pressure twice a day.  She has had normal blood sugars and her blood pressure is slightly elevated.  She is being closely monitored for her hypertension.  Patient denies any fatigue, weakness, vision changes, ear pain, sore throat, cough, congestion, chest pain, breathing difficulty, abdominal pain.  She says that it does feel different when she pees sometimes.  Her caregiver says that she has a Zofran to take as needed for her nausea and vomiting.  She did take the Zofran this afternoon when she had her nausea and vomiting and has felt okay since.  Her caregiver reports that the group home still wanted her to be checked out to make sure that she is okay.  No other complaints or concerns.  HPI  Past Medical History:  Diagnosis Date  . Cerebral palsy (HCC)   . Diabetes mellitus without complication (HCC)   . Hypercholesteremia   . Hypertension     Patient Active Problem List   Diagnosis Date Noted  . Pain due to onychomycosis of toenails of both feet 02/02/2019  . Acute gastroenteritis 01/26/2018  . 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 Aggression 07/13/2015  . Palpitations 07/06/2015  . Essential hypertension, benign 07/06/2015  . Obesity 07/06/2015  . Cerebral palsy (HCC) 12/24/2013  . Controlled type 2 diabetes mellitus without complication (HCC) 12/24/2013  . HLD (hyperlipidemia) 12/24/2013  . BP (high  blood pressure) 12/24/2013  . Adiposity 12/24/2013  . Peripheral nerve disease 12/24/2013    Past Surgical History:  Procedure Laterality Date  . DILATION AND CURETTAGE OF UTERUS      OB History   No obstetric history on file.      Home Medications    Prior to Admission medications   Medication Sig Start Date End Date Taking? Authorizing Provider  amLODipine (NORVASC) 10 MG tablet Take 10 mg by mouth daily.     [provider]  ARIPiprazole (ABILIFY) 20 MG tablet Take 20 mg by mouth daily.     [provider]  carbamazepine (TEGRETOL) 200 MG tablet Take 400 mg by mouth 2 (two) times daily.     [provider]  chlorthalidone (HYGROTON) 50 MG tablet Take 50 mg by mouth every other day.    [provider]  fluticasone (FLONASE) 50 MCG/ACT nasal spray Place 1 spray into both nostrils daily.    [provider]  furosemide (LASIX) 40 MG tablet Take 40 mg by mouth daily as needed for edema.     [provider]  glipiZIDE (GLUCOTROL XL) 10 MG 24 hr tablet Take 10 mg by mouth daily with breakfast.  09/20/14   [provider]  hydrALAZINE (APRESOLINE) 50 MG tablet Take 100 mg by mouth 3 (three) times daily.     [provider]  Insulin Glargine (LANTUS SOLOSTAR) 100 UNIT/ML Solostar Pen Inject 30 Units into the skin at bedtime.     [provider]  loratadine (CLARITIN) 10 MG tablet Take 10 mg by mouth daily.  09/21/14   [provider]  losartan (COZAAR) 100 MG tablet Take 100 mg by mouth daily.  01/04/15 02/25/18  [provider]  magnesium oxide (MAG-OX) 400 MG tablet Take 400 mg by mouth daily.    [provider]  medroxyPROGESTERone (DEPO-PROVERA) 150 MG/ML injection Inject 150 mg into the muscle every 3 (three) months.    [provider]  metFORMIN (GLUCOPHAGE) 1000 MG tablet Take 1,000 mg by mouth 2 (two) times daily with a meal.  02/21/15   [provider]    Multiple Vitamin (MULTIVITAMIN WITH MINERALS) TABS tablet Take 1 tablet by mouth daily.    [provider]  ondansetron (ZOFRAN) 4 MG tablet Take 1 tablet (4 mg total) by mouth every 6 (six) hours as needed for nausea. 01/27/18   Mayo, Allyn KennerKaty Dodd, MD  pioglitazone (ACTOS) 15 MG tablet Take 15 mg by mouth daily.     [provider]  polyethylene glycol (MIRALAX / GLYCOLAX) packet Take 17 g by mouth daily as needed for moderate constipation.    [provider]  potassium chloride SA (K-DUR,KLOR-CON) 20 MEQ tablet Take 20 mEq by mouth 2 (two) times daily.  04/13/14   [provider]  pravastatin (PRAVACHOL) 80 MG tablet Take 80 mg by mouth at bedtime.     [provider]  prazosin (MINIPRESS) 2 MG capsule Take 2 mg by mouth 2 (two) times daily.  09/20/14   [provider]  ranitidine (ZANTAC) 150 MG tablet Take 150 mg by mouth 2 (two) times daily.    [provider]  senna-docusate (SENOKOT-S) 8.6-50 MG tablet Take 1 tablet by mouth 2 (two) times daily.    [provider]  traZODone (DESYREL) 50 MG tablet Take 50 mg by mouth at bedtime.    [provider]    Family History Family History  Problem Relation Age of Onset  . Breast cancer Neg Hx     Social History Social History   Tobacco Use  . Smoking status: Never Smoker  . Smokeless tobacco: Never Used  Vaping Use  . Vaping Use: Never used  Substance Use Topics  . Alcohol use: No    Alcohol/week: 0.0 standard drinks  . Drug use: No     Allergies   Ace inhibitors   Review of Systems Review of Systems  Constitutional: Negative for chills, diaphoresis, fatigue and fever.  HENT: Negative for congestion, ear pain, rhinorrhea, sinus pressure, sinus pain and sore throat.   Respiratory: Negative for cough and shortness of breath.   Gastrointestinal: Positive for nausea and vomiting. Negative for abdominal pain and diarrhea.  Musculoskeletal: Negative for  myalgias.  Skin: Negative for rash.  Neurological: Positive for headaches. Negative for dizziness and weakness.     Physical Exam Triage Vital Signs ED Triage Vitals  Enc Vitals Group     BP 01/07/20 1733 (!) 152/72     Pulse Rate 01/07/20 1733 64     Resp 01/07/20 1733 16     Temp 01/07/20 1733 98.8 F (37.1 C)     Temp Source 01/07/20 1733 Oral     SpO2 01/07/20 1733 96 %     Weight 01/07/20 1734 220 lb 0.3 oz (99.8 kg)     Height 01/07/20 1734 5\' 7"  (1.702 m)     Head Circumference --      Peak Flow --  Pain Score 01/07/20 1733 9     Pain Loc --      Pain Edu? --      Excl. in GC? --    No data found.  Updated Vital Signs BP (!) 152/72   Pulse 64   Temp 98.8 F (37.1 C) (Oral)   Resp 16   Ht 5\' 7"  (1.702 m)   Wt 220 lb 0.3 oz (99.8 kg)   SpO2 96%   BMI 34.46 kg/m       Physical Exam Vitals and nursing note reviewed.  Constitutional:      General: She is not in acute distress.    Appearance: Normal appearance. She is not ill-appearing or toxic-appearing.  HENT:     Head: Normocephalic and atraumatic.     Right Ear: There is impacted cerumen.     Left Ear: There is impacted cerumen.     Nose: Nose normal.     Mouth/Throat:     Mouth: Mucous membranes are moist.     Pharynx: Oropharynx is clear. No posterior oropharyngeal erythema.  Eyes:     General: No scleral icterus.       Right eye: No discharge.        Left eye: No discharge.     Conjunctiva/sclera: Conjunctivae normal.     Pupils: Pupils are equal, round, and reactive to light.  Cardiovascular:     Rate and Rhythm: Normal rate and regular rhythm.     Heart sounds: Normal heart sounds.  Pulmonary:     Effort: Pulmonary effort is normal. No respiratory distress.     Breath sounds: Normal breath sounds.  Abdominal:     Palpations: Abdomen is soft.     Tenderness: There is no abdominal tenderness.  Musculoskeletal:     Cervical back: Neck supple.  Skin:    General: Skin is dry.    Neurological:     General: No focal deficit present.     Mental Status: She is alert and oriented to person, place, and time. Mental status is at baseline.     Motor: No weakness.     Gait: Gait normal.  Psychiatric:        Mood and Affect: Mood normal.        Behavior: Behavior normal. Behavior is cooperative.        Cognition and Memory: Cognition is impaired.      UC Treatments / Results  Labs (all labs ordered are listed, but only abnormal results are displayed) Labs Reviewed  URINALYSIS, COMPLETE (UACMP) WITH MICROSCOPIC - Abnormal; Notable for the following components:      Result Value   Bacteria, UA RARE (*)    All other components within normal limits    EKG   Radiology No results found.  Procedures Procedures (including critical care time)  Medications Ordered in UC Medications - No data to display  Initial Impression / Assessment and Plan / UC Course  I have reviewed the triage vital signs and the nursing notes.  Pertinent labs & imaging results that were available during my care of the patient were reviewed by me and considered in my medical decision making (see chart for details).   59 year old female presenting from a group home for evaluation of nausea vomiting and headache earlier today.  Her caregiver 60 says that this happens to her from time to time when she gets headaches.  Patient tested at the group home earlier and had a negative rapid Covid test.  Covid test not repeated today.  Caregiver also says blood sugar checked which was 102 at the group home.  Patient has been given Zofran a few hours ago for the nausea and vomiting which has subsided.  She admits to mild continued headache.  Patient has not taken anything for headache at this point.  UA performed due to complaint of possible UTI.  Urinalysis normal.  Advised caregiver to have patient rest and increase fluid intake.  Can continue to give Zofran as needed for any nausea or vomiting.   Consider giving Tylenol for headache.  Advised her to follow-up regularly with her PCP especially for control of her blood pressure which was elevated at 152/72.  Continue to keep a check on the blood sugar and the blood pressure and keep a log to follow with PCP.  Follow-up with our clinic for any new or worsening symptoms including fever or abdominal pain.  Caregiver agreeable.  Final Clinical Impressions(s) / UC Diagnoses   Final diagnoses:  Non-intractable vomiting with nausea, unspecified vomiting type  Acute nonintractable headache, unspecified headache type     Discharge Instructions     The nausea and vomiting is likely related to Savaya's headache.  Advised to take Tylenol as needed for headache.  We did not take another Covid test since she just had one.  If she starts to have a fever, cough, sore throat, breathing difficulty or other new or worsening symptoms consider having that done.  A urinalysis was normal today.  She does not appear to have a UTI.  Keep a check on her blood sugars and continue her on her diabetes medication.  Also keep an eye on her blood pressure and keep a log for her PCP.  Blood pressure was elevated today at 152/72.  Latanja's exam looks good today.  If she has a fever, abdominal pain, intractable vomiting, or any new or worsening symptoms have her seen again in our department or the ED.  She may take the Zofran as needed for her nausea.  Advised rest and increasing her fluid intake.    ED Prescriptions    None     PDMP not reviewed this encounter.   Shirlee Latch, PA-C 01/07/20 1902

## 2020-04-18 ENCOUNTER — Ambulatory Visit: Payer: Medicare Other | Admitting: Podiatry

## 2020-04-20 ENCOUNTER — Ambulatory Visit: Payer: Medicare Other | Admitting: Orthotics

## 2020-05-12 ENCOUNTER — Other Ambulatory Visit: Payer: Self-pay

## 2020-05-12 ENCOUNTER — Ambulatory Visit (INDEPENDENT_AMBULATORY_CARE_PROVIDER_SITE_OTHER): Payer: Medicare Other | Admitting: Podiatry

## 2020-05-12 ENCOUNTER — Encounter: Payer: Self-pay | Admitting: Podiatry

## 2020-05-12 DIAGNOSIS — M79674 Pain in right toe(s): Secondary | ICD-10-CM

## 2020-05-12 DIAGNOSIS — M205X2 Other deformities of toe(s) (acquired), left foot: Secondary | ICD-10-CM

## 2020-05-12 DIAGNOSIS — M205X1 Other deformities of toe(s) (acquired), right foot: Secondary | ICD-10-CM

## 2020-05-12 DIAGNOSIS — B351 Tinea unguium: Secondary | ICD-10-CM

## 2020-05-12 DIAGNOSIS — E119 Type 2 diabetes mellitus without complications: Secondary | ICD-10-CM

## 2020-05-12 DIAGNOSIS — M79675 Pain in left toe(s): Secondary | ICD-10-CM

## 2020-05-12 NOTE — Progress Notes (Signed)
This patient returns to my office for at risk foot care.  This patient requires this care by a professional since this patient will be at risk due to having CP and diabetes and neuropathy.This patient is unable to cut nails themselves since the patient cannot reach their nails.These nails are painful walking and wearing shoes.  This patient presents for at risk foot care today.  General Appearance  Alert, conversant and in no acute stress.  Vascular  Dorsalis pedis and posterior tibial  pulses are palpable  bilaterally.  Capillary return is within normal limits  bilaterally. Temperature is within normal limits  bilaterally.  Neurologic  Senn-Weinstein monofilament wire test diminished bilaterally. Muscle power within normal limits bilaterally.  Nails Thick disfigured discolored nails with subungual debris  from hallux to three  toes bilaterally. No evidence of bacterial infection or drainage bilaterally.  Orthopedic  No limitations of motion  feet .  No crepitus or effusions noted.  No bony pathology or digital deformities noted.  Hallux limitus  1st MPJ  B/L.  Skin  normotropic skin with no porokeratosis noted bilaterally.  No signs of infections or ulcers noted.     Onychomycosis  Pain in right toes  Pain in left toes  Consent was obtained for treatment procedures.   Mechanical debridement of nails 1-3  bilaterally performed with a nail nipper.  Filed with dremel without incident. No infection or ulcer.  She qualifies for diabetic shoes.   Return office visit  6 months         Told patient to return for periodic foot care and evaluation due to potential at risk complications.   Helane Gunther DPM

## 2020-05-17 ENCOUNTER — Other Ambulatory Visit: Payer: Self-pay | Admitting: Internal Medicine

## 2020-05-17 DIAGNOSIS — Z1231 Encounter for screening mammogram for malignant neoplasm of breast: Secondary | ICD-10-CM

## 2020-05-18 ENCOUNTER — Ambulatory Visit: Payer: Medicare Other | Admitting: Orthotics

## 2020-05-18 ENCOUNTER — Other Ambulatory Visit: Payer: Self-pay

## 2020-05-18 DIAGNOSIS — M205X2 Other deformities of toe(s) (acquired), left foot: Secondary | ICD-10-CM

## 2020-05-18 DIAGNOSIS — M79674 Pain in right toe(s): Secondary | ICD-10-CM

## 2020-05-18 DIAGNOSIS — E119 Type 2 diabetes mellitus without complications: Secondary | ICD-10-CM

## 2020-05-18 NOTE — Progress Notes (Signed)

## 2020-06-02 ENCOUNTER — Ambulatory Visit: Payer: Medicare Other

## 2020-06-09 ENCOUNTER — Inpatient Hospital Stay: Admission: RE | Admit: 2020-06-09 | Payer: Medicare Other | Source: Ambulatory Visit

## 2020-06-15 ENCOUNTER — Ambulatory Visit: Payer: Medicare Other | Admitting: Orthotics

## 2020-06-22 ENCOUNTER — Ambulatory Visit
Admission: RE | Admit: 2020-06-22 | Discharge: 2020-06-22 | Disposition: A | Discharge status: Home / Self Care | Payer: Medicare Other | Source: Ambulatory Visit | Attending: Internal Medicine | Admitting: Internal Medicine

## 2020-06-22 ENCOUNTER — Other Ambulatory Visit: Payer: Self-pay

## 2020-06-22 DIAGNOSIS — Z1231 Encounter for screening mammogram for malignant neoplasm of breast: Secondary | ICD-10-CM | POA: Diagnosis not present

## 2020-07-12 ENCOUNTER — Other Ambulatory Visit: Payer: Self-pay | Admitting: Internal Medicine

## 2020-07-12 DIAGNOSIS — Z1231 Encounter for screening mammogram for malignant neoplasm of breast: Secondary | ICD-10-CM

## 2020-08-16 DIAGNOSIS — R600 Localized edema: Secondary | ICD-10-CM | POA: Insufficient documentation

## 2020-11-10 ENCOUNTER — Ambulatory Visit: Payer: Medicare Other | Admitting: Podiatry

## 2020-11-14 ENCOUNTER — Encounter: Payer: Self-pay | Admitting: Podiatry

## 2020-11-14 ENCOUNTER — Other Ambulatory Visit: Payer: Self-pay

## 2020-11-14 ENCOUNTER — Ambulatory Visit (INDEPENDENT_AMBULATORY_CARE_PROVIDER_SITE_OTHER): Payer: Medicare Other | Admitting: Podiatry

## 2020-11-14 DIAGNOSIS — B351 Tinea unguium: Secondary | ICD-10-CM | POA: Diagnosis not present

## 2020-11-14 DIAGNOSIS — M2022 Hallux rigidus, left foot: Secondary | ICD-10-CM

## 2020-11-14 DIAGNOSIS — E119 Type 2 diabetes mellitus without complications: Secondary | ICD-10-CM

## 2020-11-14 DIAGNOSIS — M79675 Pain in left toe(s): Secondary | ICD-10-CM

## 2020-11-14 DIAGNOSIS — M79674 Pain in right toe(s): Secondary | ICD-10-CM | POA: Diagnosis not present

## 2020-11-14 DIAGNOSIS — M2021 Hallux rigidus, right foot: Secondary | ICD-10-CM | POA: Diagnosis not present

## 2020-11-14 NOTE — Progress Notes (Addendum)
This patient returns to my office for at risk foot care.  This patient requires this care by a professional since this patient will be at risk due to having CP and diabetes and neuropathy.This patient is unable to cut nails themselves since the patient cannot reach their nails.These nails are painful walking and wearing shoes.  This patient presents for at risk foot care today.  Patient presents to the office to pick up her new diabetic shoes.  General Appearance  Alert, conversant and in no acute stress.  Vascular  Dorsalis pedis and posterior tibial  pulses are palpable  bilaterally.  Capillary return is within normal limits  bilaterally. Temperature is within normal limits  bilaterally.  Neurologic  Senn-Weinstein monofilament wire test diminished bilaterally. Muscle power within normal limits bilaterally.  Nails Thick disfigured discolored nails with subungual debris  from hallux to three  toes bilaterally. No evidence of bacterial infection or drainage bilaterally.  Orthopedic  No limitations of motion  feet .  No crepitus or effusions noted.  No bony pathology or digital deformities noted.  Hallux limitus  1st MPJ  B/L.  Skin  normotropic skin with no porokeratosis noted bilaterally.  No signs of infections or ulcers noted.   Callus on tip of right hallux.  Onychomycosis  Pain in right toes  Pain in left toes  Consent was obtained for treatment procedures.   Mechanical debridement of nails 1-3  bilaterally performed with a nail nipper.  Filed with dremel without incident. No infection or ulcer.  Patient wore nail polish at todays visit.   Return office visit  6 months         Told patient to return for periodic foot care and evaluation due to potential at risk complications.  Patient presents today and was dispensed 0ne pair ( two units) of medically necessary extra depth shoes with three pair( six units) of custom molded multiple density inserts. The shoes and the inserts are fitted to  the patients ' feet and are noted to fit well and are free of defect.  Length and width of the shoes are also acceptable.  Patient was given written and verbal  instructions for wearing.  If any concerns arrive with the shoes or inserts, the patient is to call the office.Patient is to follow up with doctor in six weeks.    Helane Gunther DPM

## 2021-01-31 ENCOUNTER — Ambulatory Visit (INDEPENDENT_AMBULATORY_CARE_PROVIDER_SITE_OTHER): Payer: Medicare Other | Admitting: Podiatry

## 2021-01-31 ENCOUNTER — Other Ambulatory Visit: Payer: Self-pay

## 2021-01-31 DIAGNOSIS — M79671 Pain in right foot: Secondary | ICD-10-CM

## 2021-01-31 DIAGNOSIS — L989 Disorder of the skin and subcutaneous tissue, unspecified: Secondary | ICD-10-CM | POA: Diagnosis not present

## 2021-01-31 DIAGNOSIS — E0843 Diabetes mellitus due to underlying condition with diabetic autonomic (poly)neuropathy: Secondary | ICD-10-CM | POA: Diagnosis not present

## 2021-01-31 NOTE — Progress Notes (Signed)
   Subjective: 60 y.o. female presenting to the office today for new complaint regarding pain and tenderness to the right posterior heel.  Patient presents today with her caregiver.  Apparently the patient complains of some heel pain for the past month.  It was observed in her living facility that she had a possible wound to the right posterior heel.  She got an appointment and she presents for further treatment and evaluation.  Currently there is not no anything for treatment   Past Medical History:  Diagnosis Date   Cerebral palsy (HCC)    Diabetes mellitus without complication (HCC)    Hypercholesteremia    Hypertension      Objective:  Physical Exam General: Alert and oriented x3 in no acute distress  Dermatology: Hyperkeratotic lesion(s) present on the posterior heel right. Pain on palpation with a central nucleated core noted. Skin is warm, dry and supple bilateral lower extremities. Negative for open lesions or macerations.  Vascular: Palpable pedal pulses bilaterally. No edema or erythema noted. Capillary refill within normal limits.  Neurological: Epicritic and protective threshold diminished bilaterally.   Musculoskeletal Exam: Pain on palpation at the keratotic lesion(s) noted.  No pedal deformities noted  Assessment: 1.  Diabetes mellitus with peripheral polyneuropathy 2.  Preulcerative callus lesion right posterior heel   Plan of Care:  1. Patient evaluated 2. Excisional debridement of keratoic lesion(s) using a chisel blade was performed without incident.  3.  Recommend daily foot lotion 4. Patient is to return to the clinic neck scheduled appointment for routine foot care  Felecia Shelling, DPM [facility]  Dr. Felecia Shelling, DPM    [address] [place], [place] [address]                Office [phone]  Fax [phone]

## 2021-04-14 ENCOUNTER — Ambulatory Visit (INDEPENDENT_AMBULATORY_CARE_PROVIDER_SITE_OTHER): Payer: Medicare Other | Admitting: Podiatry

## 2021-04-14 ENCOUNTER — Other Ambulatory Visit: Payer: Self-pay

## 2021-04-14 ENCOUNTER — Encounter: Payer: Self-pay | Admitting: Podiatry

## 2021-04-14 DIAGNOSIS — E0843 Diabetes mellitus due to underlying condition with diabetic autonomic (poly)neuropathy: Secondary | ICD-10-CM

## 2021-04-14 DIAGNOSIS — L989 Disorder of the skin and subcutaneous tissue, unspecified: Secondary | ICD-10-CM | POA: Diagnosis not present

## 2021-04-14 DIAGNOSIS — M79675 Pain in left toe(s): Secondary | ICD-10-CM

## 2021-04-14 DIAGNOSIS — M79674 Pain in right toe(s): Secondary | ICD-10-CM | POA: Diagnosis not present

## 2021-04-14 DIAGNOSIS — B351 Tinea unguium: Secondary | ICD-10-CM

## 2021-04-14 NOTE — Progress Notes (Signed)
° °  Subjective: 61 y.o. female presenting to the office today for routine diabetic foot care.  Patient states that she continues to have some posterior heel pain to the right lower extremity.  She presents for further treatment and evaluation   Past Medical History:  Diagnosis Date   Cerebral palsy (HCC)    Diabetes mellitus without complication (HCC)    Hypercholesteremia    Hypertension      Objective:  Physical Exam General: Alert and oriented x3 in no acute distress  Dermatology: Hyperkeratotic lesion(s) present on the posterior heel right. Pain on palpation with a central nucleated core noted. Skin is warm, dry and supple bilateral lower extremities. Negative for open lesions or macerations. Hyperkeratotic dystrophic nails noted 1-5 bilateral  Vascular: Palpable pedal pulses bilaterally. No edema or erythema noted. Capillary refill within normal limits.  Neurological: Epicritic and protective threshold diminished bilaterally.   Musculoskeletal Exam: Pain on palpation at the keratotic lesion(s) noted.  No pedal deformities noted  Assessment: 1.  Diabetes mellitus with peripheral polyneuropathy 2.  Preulcerative callus lesion right posterior heel 3.  Pain due to onychomycosis of toenails both   Plan of Care:  1. Patient evaluated 2. Excisional debridement of keratoic lesion(s) using a chisel blade was performed without incident.  3.  Mechanical debridement of nails 1-5 bilateral was performed using a nail nipper 4.  Recommend daily foot lotion 5.  Continue diabetic shoes and insoles  6.  Return to clinic in 3 months for routine foot care  Felecia Shelling, DPM [facility]  Dr. Felecia Shelling, DPM    [address] [place], [place] [address]                Office [phone]  Fax [phone]

## 2021-05-15 ENCOUNTER — Ambulatory Visit: Payer: Medicare Other | Admitting: Podiatry

## 2021-05-23 ENCOUNTER — Other Ambulatory Visit: Payer: Self-pay | Admitting: Internal Medicine

## 2021-05-23 DIAGNOSIS — Z1231 Encounter for screening mammogram for malignant neoplasm of breast: Secondary | ICD-10-CM

## 2021-06-30 ENCOUNTER — Ambulatory Visit
Admission: RE | Admit: 2021-06-30 | Discharge: 2021-06-30 | Disposition: A | Discharge status: Home / Self Care | Payer: Medicare Other | Source: Ambulatory Visit | Attending: Internal Medicine | Admitting: Internal Medicine

## 2021-06-30 DIAGNOSIS — Z1231 Encounter for screening mammogram for malignant neoplasm of breast: Secondary | ICD-10-CM | POA: Diagnosis present

## 2021-07-24 ENCOUNTER — Ambulatory Visit: Payer: Medicare Other | Admitting: Podiatry

## 2021-08-03 ENCOUNTER — Encounter: Payer: Self-pay | Admitting: Podiatry

## 2021-08-03 ENCOUNTER — Ambulatory Visit (INDEPENDENT_AMBULATORY_CARE_PROVIDER_SITE_OTHER): Payer: Medicare Other | Admitting: Podiatry

## 2021-08-03 DIAGNOSIS — M79675 Pain in left toe(s): Secondary | ICD-10-CM

## 2021-08-03 DIAGNOSIS — M79674 Pain in right toe(s): Secondary | ICD-10-CM | POA: Diagnosis not present

## 2021-08-03 DIAGNOSIS — B351 Tinea unguium: Secondary | ICD-10-CM | POA: Diagnosis not present

## 2021-08-03 DIAGNOSIS — E0843 Diabetes mellitus due to underlying condition with diabetic autonomic (poly)neuropathy: Secondary | ICD-10-CM

## 2021-08-03 NOTE — Progress Notes (Signed)
This patient returns to my office for at risk foot care.  This patient requires this care by a professional since this patient will be at risk due to having CP and diabetes and neuropathy.This patient is unable to cut nails themselves since the patient cannot reach their nails.These nails are painful walking and wearing shoes.  This patient presents for at risk foot care today.  Patient presents to the office to pick up her new diabetic shoes. ? ?General Appearance  Alert, conversant and in no acute stress. ? ?Vascular  Dorsalis pedis and posterior tibial  pulses are palpable  bilaterally.  Capillary return is within normal limits  bilaterally. Temperature is within normal limits  bilaterally. ? ?Neurologic  Senn-Weinstein monofilament wire test diminished bilaterally. Muscle power within normal limits bilaterally. ? ?Nails Thick disfigured discolored nails with subungual debris  from hallux to three  toes bilaterally. No evidence of bacterial infection or drainage bilaterally. ? ?Orthopedic  No limitations of motion  feet .  No crepitus or effusions noted.  No bony pathology or digital deformities noted.  Hallux limitus  1st MPJ  B/L. ? ?Skin  normotropic skin with no porokeratosis noted bilaterally.  No signs of infections or ulcers noted.   Callus on tip of right hallux. ? ?Onychomycosis  Pain in right toes  Pain in left toes ? ?Consent was obtained for treatment procedures.   Mechanical debridement of nails 1-3  bilaterally performed with a nail nipper.  Filed with dremel without incident. No infection or ulcer.   ? ? ?Return office visit 4  months         Told patient to return for periodic foot care and evaluation due to potential at risk complications. ? ? ? ? ?Helane Gunther DPM  ?

## 2021-10-16 ENCOUNTER — Ambulatory Visit: Payer: Medicare Other | Admitting: Podiatry

## 2021-11-02 ENCOUNTER — Ambulatory Visit (INDEPENDENT_AMBULATORY_CARE_PROVIDER_SITE_OTHER): Payer: Medicare Other | Admitting: Podiatry

## 2021-11-02 ENCOUNTER — Encounter: Payer: Self-pay | Admitting: Podiatry

## 2021-11-02 DIAGNOSIS — L989 Disorder of the skin and subcutaneous tissue, unspecified: Secondary | ICD-10-CM

## 2021-11-02 DIAGNOSIS — E0843 Diabetes mellitus due to underlying condition with diabetic autonomic (poly)neuropathy: Secondary | ICD-10-CM | POA: Diagnosis not present

## 2021-11-02 NOTE — Progress Notes (Signed)
This patient returns to my office for at risk foot care.  This patient requires this care by a professional since this patient will be at risk due to having CP and diabetes and neuropathy.This patient has painful callus on the back of her heel right foot.  She was previously treated for this problem by Dr.  Logan Bores with debridement.  She presents to the office for evaluation and treatment.  General Appearance  Alert, conversant and in no acute stress.  Vascular  Dorsalis pedis and posterior tibial  pulses are palpable  bilaterally.  Capillary return is within normal limits  bilaterally. Temperature is within normal limits  bilaterally.  Neurologic  Senn-Weinstein monofilament wire test diminished bilaterally. Muscle power within normal limits bilaterally.  Nails Thick disfigured discolored nails with subungual debris  from hallux to three  toes bilaterally. No evidence of bacterial infection or drainage bilaterally.  Orthopedic  No limitations of motion  feet .  No crepitus or effusions noted.  No bony pathology or digital deformities noted.  Hallux limitus  1st MPJ  B/L.  Skin  normotropic skin with no porokeratosis noted bilaterally.  No signs of infections or ulcers noted.   Callus on tip of right hallux.  Callus on plantar posterior skin right heel.  No redness pr swelling or drainage.  Callus right foot.  Consent was obtained for treatment procedures.  Debridement of callus with # 15 blade and dremel tool usage.  No infection or ulcer.     Return office visit 1  months         Told patient to return for periodic foot care and evaluation due to potential at risk complications.     Helane Gunther DPM

## 2021-12-07 ENCOUNTER — Ambulatory Visit: Payer: Medicare Other | Admitting: Podiatry

## 2022-01-08 ENCOUNTER — Ambulatory Visit: Payer: Medicare Other | Admitting: Podiatry

## 2022-02-08 ENCOUNTER — Encounter: Payer: Self-pay | Admitting: Podiatry

## 2022-02-08 ENCOUNTER — Ambulatory Visit (INDEPENDENT_AMBULATORY_CARE_PROVIDER_SITE_OTHER): Payer: Medicare Other | Admitting: Podiatry

## 2022-02-08 DIAGNOSIS — M79674 Pain in right toe(s): Secondary | ICD-10-CM | POA: Diagnosis not present

## 2022-02-08 DIAGNOSIS — E0843 Diabetes mellitus due to underlying condition with diabetic autonomic (poly)neuropathy: Secondary | ICD-10-CM | POA: Diagnosis not present

## 2022-02-08 DIAGNOSIS — B351 Tinea unguium: Secondary | ICD-10-CM | POA: Diagnosis not present

## 2022-02-08 DIAGNOSIS — L989 Disorder of the skin and subcutaneous tissue, unspecified: Secondary | ICD-10-CM

## 2022-02-08 DIAGNOSIS — M79675 Pain in left toe(s): Secondary | ICD-10-CM | POA: Diagnosis not present

## 2022-02-08 NOTE — Progress Notes (Signed)
This patient returns to my office for at risk foot care.  This patient requires this care by a professional since this patient will be at risk due to having CP and diabetes and neuropathy.This patient has painful callus on the back of her heel right foot.  She was previously treated for this problem by Dr.  Evans with debridement.  She presents to the office for evaluation and treatment.  General Appearance  Alert, conversant and in no acute stress.  Vascular  Dorsalis pedis and posterior tibial  pulses are palpable  bilaterally.  Capillary return is within normal limits  bilaterally. Temperature is within normal limits  bilaterally.  Neurologic  Senn-Weinstein monofilament wire test diminished bilaterally. Muscle power within normal limits bilaterally.  Nails Thick disfigured discolored nails with subungual debris  from hallux to three  toes bilaterally. No evidence of bacterial infection or drainage bilaterally.  Orthopedic  No limitations of motion  feet .  No crepitus or effusions noted.  No bony pathology or digital deformities noted.  Hallux limitus  1st MPJ  B/L.  Skin  normotropic skin with no porokeratosis noted bilaterally.  No signs of infections or ulcers noted.   Callus on tip of right hallux.  Callus on plantar posterior skin right heel.  No redness pr swelling or drainage.  Callus right foot.  Consent was obtained for treatment procedures.  Debridement of callus with # 15 blade and dremel tool usage.  No infection or ulcer.     Return office visit 3   months         Told patient to return for periodic foot care and evaluation due to potential at risk complications.     Lilah Mijangos DPM  

## 2022-05-17 ENCOUNTER — Encounter: Payer: Self-pay | Admitting: Podiatry

## 2022-05-17 ENCOUNTER — Ambulatory Visit (INDEPENDENT_AMBULATORY_CARE_PROVIDER_SITE_OTHER): Payer: Medicare Other | Admitting: Podiatry

## 2022-05-17 VITALS — BP 161/75 | HR 67

## 2022-05-17 DIAGNOSIS — E0843 Diabetes mellitus due to underlying condition with diabetic autonomic (poly)neuropathy: Secondary | ICD-10-CM | POA: Diagnosis not present

## 2022-05-17 DIAGNOSIS — M79675 Pain in left toe(s): Secondary | ICD-10-CM

## 2022-05-17 DIAGNOSIS — B351 Tinea unguium: Secondary | ICD-10-CM | POA: Diagnosis not present

## 2022-05-17 DIAGNOSIS — M79674 Pain in right toe(s): Secondary | ICD-10-CM

## 2022-05-17 DIAGNOSIS — Q828 Other specified congenital malformations of skin: Secondary | ICD-10-CM

## 2022-05-17 NOTE — Progress Notes (Signed)
This patient returns to my office for at risk foot care.  This patient requires this care by a professional since this patient will be at risk due to having CP and diabetes and neuropathy.This patient has painful callus on the back of her heel right foot.  She was previously treated for this problem by Dr.  Amalia Hailey with debridement.  She presents to the office for evaluation and treatment.  General Appearance  Alert, conversant and in no acute stress.  Vascular  Dorsalis pedis and posterior tibial  pulses are palpable  bilaterally.  Capillary return is within normal limits  bilaterally. Temperature is within normal limits  bilaterally.  Neurologic  Senn-Weinstein monofilament wire test diminished bilaterally. Muscle power within normal limits bilaterally.  Nails Thick disfigured discolored nails with subungual debris  from hallux to three  toes bilaterally. No evidence of bacterial infection or drainage bilaterally.  Orthopedic  No limitations of motion  feet .  No crepitus or effusions noted.  No bony pathology or digital deformities noted.  Hallux limitus  1st MPJ  B/L.  Skin  normotropic skin with no porokeratosis noted bilaterally.  No signs of infections or ulcers noted.   Callus on tip of right hallux.  Callus on plantar posterior skin right heel.  No redness pr swelling or drainage.  Callus right foot.  Consent was obtained for treatment procedures.  Debridement of callus with # 15 blade and dremel tool usage.  No infection or ulcer.     Return office visit 3   months         Told patient to return for periodic foot care and evaluation due to potential at risk complications.     Gardiner Barefoot DPM

## 2022-05-23 ENCOUNTER — Other Ambulatory Visit: Payer: Self-pay | Admitting: Internal Medicine

## 2022-05-23 DIAGNOSIS — Z1231 Encounter for screening mammogram for malignant neoplasm of breast: Secondary | ICD-10-CM

## 2022-07-04 ENCOUNTER — Ambulatory Visit
Admission: RE | Admit: 2022-07-04 | Discharge: 2022-07-04 | Disposition: A | Discharge status: Home / Self Care | Payer: Medicare Other | Source: Ambulatory Visit | Attending: Internal Medicine | Admitting: Internal Medicine

## 2022-07-04 DIAGNOSIS — Z1231 Encounter for screening mammogram for malignant neoplasm of breast: Secondary | ICD-10-CM | POA: Insufficient documentation

## 2022-08-17 ENCOUNTER — Ambulatory Visit: Payer: Medicare Other | Admitting: Podiatry

## 2022-09-10 ENCOUNTER — Ambulatory Visit (INDEPENDENT_AMBULATORY_CARE_PROVIDER_SITE_OTHER): Payer: Medicare Other | Admitting: Podiatry

## 2022-09-10 VITALS — BP 165/66

## 2022-09-10 DIAGNOSIS — M79674 Pain in right toe(s): Secondary | ICD-10-CM | POA: Diagnosis not present

## 2022-09-10 DIAGNOSIS — M205X2 Other deformities of toe(s) (acquired), left foot: Secondary | ICD-10-CM

## 2022-09-10 DIAGNOSIS — B351 Tinea unguium: Secondary | ICD-10-CM | POA: Diagnosis not present

## 2022-09-10 DIAGNOSIS — E119 Type 2 diabetes mellitus without complications: Secondary | ICD-10-CM

## 2022-09-10 DIAGNOSIS — E1142 Type 2 diabetes mellitus with diabetic polyneuropathy: Secondary | ICD-10-CM | POA: Diagnosis not present

## 2022-09-10 DIAGNOSIS — L84 Corns and callosities: Secondary | ICD-10-CM

## 2022-09-10 DIAGNOSIS — M79675 Pain in left toe(s): Secondary | ICD-10-CM | POA: Diagnosis not present

## 2022-09-10 DIAGNOSIS — M205X1 Other deformities of toe(s) (acquired), right foot: Secondary | ICD-10-CM

## 2022-09-15 ENCOUNTER — Encounter: Payer: Self-pay | Admitting: Podiatry

## 2022-09-15 NOTE — Progress Notes (Signed)
ANNUAL DIABETIC FOOT EXAM  Subjective: Rachel [name] presents today annual diabetic foot exam. Patient c/o callus on back of right heel. Chief Complaint  Patient presents with   Nail Problem    DFC,Referring Provider Gracelyn Nurse, MD,LOV:03/24,A1C:6.0,BS:unknown    Patient confirms h/o diabetes.  Patient denies any h/o foot wounds.  Patient has been diagnosed with neuropathy.  Risk factors: diabetes, neuropathy, HTN, hyperlipidemia, hypercholesterolemia.  Gracelyn Nurse, MD is patient's PCP.  Past Medical History:  Diagnosis Date   Cerebral palsy (HCC)    Diabetes mellitus without complication (HCC)    Hypercholesteremia    Hypertension    Patient Active Problem List   Diagnosis Date Noted   Porokeratosis 05/17/2022   Pain due to onychomycosis of toenails of both feet 02/02/2019   Acute gastroenteritis 01/26/2018   Moderate mental retardation 07/13/2015   Aggression 07/13/2015   Palpitations 07/06/2015   Essential hypertension, benign 07/06/2015   Obesity 07/06/2015   Cerebral palsy (HCC) 12/24/2013   Controlled type 2 diabetes mellitus without complication (HCC) 12/24/2013   HLD (hyperlipidemia) 12/24/2013   BP (high blood pressure) 12/24/2013   Adiposity 12/24/2013   Peripheral nerve disease 12/24/2013   Past Surgical History:  Procedure Laterality Date   DILATION AND CURETTAGE OF UTERUS     Current Outpatient Medications on File Prior to Visit  Medication Sig Dispense Refill   amLODipine (NORVASC) 10 MG tablet Take 10 mg by mouth daily.      ARIPiprazole (ABILIFY) 20 MG tablet Take 20 mg by mouth daily.      carbamazepine (TEGRETOL) 200 MG tablet Take 400 mg by mouth 2 (two) times daily.      chlorthalidone (HYGROTON) 50 MG tablet Take 50 mg by mouth every other day.     fluticasone (FLONASE) 50 MCG/ACT nasal spray Place 1 spray into both nostrils daily.     furosemide (LASIX) 40 MG tablet Take 40 mg by mouth daily as needed for edema.       glipiZIDE (GLUCOTROL XL) 10 MG 24 hr tablet Take 10 mg by mouth daily with breakfast.      hydrALAZINE (APRESOLINE) 50 MG tablet Take 100 mg by mouth 3 (three) times daily.      Insulin Glargine (LANTUS SOLOSTAR) 100 UNIT/ML Solostar Pen Inject 30 Units into the skin at bedtime.      loratadine (CLARITIN) 10 MG tablet Take 10 mg by mouth daily.      magnesium oxide (MAG-OX) 400 MG tablet Take 400 mg by mouth daily.     medroxyPROGESTERone (DEPO-PROVERA) 150 MG/ML injection Inject 150 mg into the muscle every 3 (three) months.     metFORMIN (GLUCOPHAGE) 1000 MG tablet Take 1,000 mg by mouth 2 (two) times daily with a meal.      Multiple Vitamin (MULTIVITAMIN WITH MINERALS) TABS tablet Take 1 tablet by mouth daily.     ondansetron (ZOFRAN) 4 MG tablet Take 1 tablet (4 mg total) by mouth every 6 (six) hours as needed for nausea. 20 tablet 0   pioglitazone (ACTOS) 15 MG tablet Take 15 mg by mouth daily.      polyethylene glycol (MIRALAX / GLYCOLAX) packet Take 17 g by mouth daily as needed for moderate constipation.     potassium chloride SA (K-DUR,KLOR-CON) 20 MEQ tablet Take 20 mEq by mouth 2 (two) times daily.      pravastatin (PRAVACHOL) 80 MG tablet Take 80 mg by mouth at bedtime.      prazosin (MINIPRESS) 2 MG  capsule Take 2 mg by mouth 2 (two) times daily.      ranitidine (ZANTAC) 150 MG tablet Take 150 mg by mouth 2 (two) times daily.     senna-docusate (SENOKOT-S) 8.6-50 MG tablet Take 1 tablet by mouth 2 (two) times daily.     traZODone (DESYREL) 50 MG tablet Take 50 mg by mouth at bedtime.     losartan (COZAAR) 100 MG tablet Take 100 mg by mouth daily.      No current facility-administered medications on file prior to visit.    Allergies  Allergen Reactions   Ace Inhibitors Swelling   Social History   Occupational History   Not on file  Tobacco Use   Smoking status: Never   Smokeless tobacco: Never  Vaping Use   Vaping Use: Never used  Substance and Sexual Activity   Alcohol  use: No    Alcohol/week: 0.0 standard drinks of alcohol   Drug use: No   Sexual activity: Not Currently   Family History  Problem Relation Age of Onset   Breast cancer Neg Hx     There is no immunization history on file for this patient.   Review of Systems: Negative except as noted in the HPI.   Objective: Vitals:   09/10/22 1032 09/10/22 1033  BP: (!) 184/74 (!) 165/66   Rachel [name] is a pleasant 62 y.o. female in NAD. AAO X 3.  Vascular Examination: Capillary refill time immediate b/l. Vascular status intact b/l with palpable pedal pulses. Pedal hair present b/l. No pain with calf compression b/l. Skin temperature gradient WNL b/l. No cyanosis or clubbing b/l. No ischemia or gangrene noted b/l.   Neurological Examination: Protective sensation diminished with 10g monofilament b/l.  Dermatological Examination: Pedal skin with normal turgor, texture and tone b/l.  No open wounds. No interdigital macerations.   Toenails 1-5 b/l thick, discolored, elongated with subungual debris and pain on dorsal palpation.   Pedal integument with normal turgor, texture and tone BLE. No open wounds b/l LE. No interdigital macerations noted b/l LE. Toenails 1-5 b/l elongated, discolored, dystrophic, thickened, crumbly with subungual debris and tenderness to dorsal palpation. Hyperkeratotic lesion(s) right heel and R hallux.  No erythema, no edema, no drainage, no fluctuance.  Musculoskeletal Examination: Muscle strength 5/5 to all lower extremity muscle groups bilaterally. Limited joint ROM to the 1st MPJ b/l.  Radiographs: None  ADA Risk Categorization: High Risk  Patient has one or more of the following: Loss of protective sensation Absent pedal pulses Severe Foot deformity History of foot ulcer  Assessment: 1. Pain due to onychomycosis of toenails of both feet   2. Callus   3. Acquired hallux limitus of both feet   4. Diabetic peripheral neuropathy associated with type 2  diabetes mellitus (HCC)   5. Encounter for diabetic foot exam [facility])     Plan: -Caregiver/provider present with patient on today's visit. -Diabetic foot examination performed today. -Continue diabetic foot care principles: inspect feet daily, monitor glucose as recommended by PCP and/or Endocrinologist, and follow prescribed diet per PCP, Endocrinologist and/or dietician. -Patient to continue soft, supportive shoe gear daily. -Toenails 1-5 b/l were debrided in length and girth with sterile nail nippers and dremel without iatrogenic bleeding.  -Callus(es) right heel and right great toe pared utilizing sterile scalpel blade without complication or incident. Total number debrided =2. -Patient/POA to call should there be question/concern in the interim. Return in about 3 months (around 12/11/2022).  Freddie Breech, DPM

## 2022-12-20 ENCOUNTER — Encounter: Payer: Self-pay | Admitting: Podiatry

## 2022-12-20 ENCOUNTER — Ambulatory Visit (INDEPENDENT_AMBULATORY_CARE_PROVIDER_SITE_OTHER): Payer: Medicare Other | Admitting: Podiatry

## 2022-12-20 DIAGNOSIS — L84 Corns and callosities: Secondary | ICD-10-CM

## 2022-12-20 DIAGNOSIS — B351 Tinea unguium: Secondary | ICD-10-CM

## 2022-12-20 DIAGNOSIS — M79675 Pain in left toe(s): Secondary | ICD-10-CM

## 2022-12-20 DIAGNOSIS — E1142 Type 2 diabetes mellitus with diabetic polyneuropathy: Secondary | ICD-10-CM | POA: Diagnosis not present

## 2022-12-20 DIAGNOSIS — M79674 Pain in right toe(s): Secondary | ICD-10-CM | POA: Diagnosis not present

## 2022-12-20 NOTE — Progress Notes (Signed)
Subjective:  Patient ID: Rachel [name], female    DOB: [birth year],  MRN: [id number]  62 y.o. female presents at risk foot care with history of diabetic neuropathy and callus(es) right foot and painful thick toenails that are difficult to trim. Painful toenails interfere with ambulation. Aggravating factors include wearing enclosed shoe gear. Pain is relieved with periodic professional debridement. Painful calluses are aggravated when weightbearing with and without shoegear. Pain is relieved with periodic professional debridement. Chief Complaint  Patient presents with   Nail Problem    DFC,Referring Provider Gracelyn Nurse, MD,lov:07/24,A1C:7.6      New problem(s): None   PCP is Gracelyn Nurse, MD , and last visit was .  Allergies  Allergen Reactions   Ace Inhibitors Swelling   Review of Systems: Negative except as noted in the HPI.   Objective:  Rachel [name] is a pleasant 62 y.o. female in NAD.Marland Kitchen AAO x 3.  Vascular Examination: Vascular status intact b/l with palpable pedal pulses. CFT immediate b/l. Pedal hair present. No edema. No pain with calf compression b/l. Skin temperature gradient WNL b/l. No varicosities noted. No cyanosis or clubbing noted.  Neurological Examination: Sensation grossly intact b/l with 10 gram monofilament. Vibratory sensation intact b/l.  Dermatological Examination: Pedal skin with normal turgor, texture and tone b/l. No open wounds nor interdigital macerations noted. Toenails 1-5 b/l thick, discolored, elongated with subungual debris and pain on dorsal palpation. No hyperkeratotic lesions noted b/l. Hyperkeratotic lesion(s) right heel and right great toe.  No erythema, no edema, no drainage, no fluctuance.  Musculoskeletal Examination: Muscle strength 5/5 to b/l LE.  No pain, crepitus noted b/l. No gross pedal deformities. Patient ambulates independently without assistive aids.   Radiographs: None   Assessment:   1. Pain due to  onychomycosis of toenails of both feet   2. Callus   3. Diabetic peripheral neuropathy associated with type 2 diabetes mellitus (HCC)    Plan:  -Caregiver/provider present with patient on today's visit. -Continue supportive shoe gear daily. -Mycotic toenails 1-5 bilaterally were debrided in length and girth with sterile nail nippers and dremel without incident. -Callus(es) right heel and R hallux pared utilizing sterile scalpel blade without complication or incident. Total number debrided =2. -Patient/POA to call should there be question/concern in the interim.  Return in about 3 months (around 03/21/2023).  Freddie Breech, DPM

## 2023-03-21 ENCOUNTER — Encounter: Payer: Self-pay | Admitting: Podiatry

## 2023-03-21 ENCOUNTER — Ambulatory Visit: Payer: Medicare Other | Admitting: Podiatry

## 2023-03-21 DIAGNOSIS — L84 Corns and callosities: Secondary | ICD-10-CM | POA: Diagnosis not present

## 2023-03-21 DIAGNOSIS — M79675 Pain in left toe(s): Secondary | ICD-10-CM

## 2023-03-21 DIAGNOSIS — B351 Tinea unguium: Secondary | ICD-10-CM

## 2023-03-21 DIAGNOSIS — M79674 Pain in right toe(s): Secondary | ICD-10-CM

## 2023-03-21 DIAGNOSIS — E1142 Type 2 diabetes mellitus with diabetic polyneuropathy: Secondary | ICD-10-CM

## 2023-03-21 NOTE — Progress Notes (Signed)
  Subjective:  Patient ID: Rachel [name], female    DOB: 09-11-1960,  MRN: [id number]  62 y.o. female presents at risk foot care with history of diabetic neuropathy and callus(es) right foot and painful thick toenails that are difficult to trim. Painful toenails interfere with ambulation. Aggravating factors include wearing enclosed shoe gear. Pain is relieved with periodic professional debridement. Painful calluses are aggravated when weightbearing with and without shoegear. Pain is relieved with periodic professional debridement.  New problem(s): None   PCP is Gracelyn Nurse, MD , and last visit was January 29, 2023.  Allergies  Allergen Reactions   Ace Inhibitors Swelling    Review of Systems: Negative except as noted in the HPI.   Objective:  Rachel [name] is a pleasant 62 y.o. female in NAD. AAO x 3.  Vascular Examination: Vascular status intact b/l with palpable pedal pulses. CFT immediate b/l. Pedal hair present. No edema. No pain with calf compression b/l. Skin temperature gradient WNL b/l. No varicosities noted. No cyanosis or clubbing noted.  Neurological Examination: Pt has subjective symptoms of neuropathy. Protective sensation decreased with 10 gram monofilament b/l.  Dermatological Examination: Pedal skin with normal turgor, texture and tone b/l. No open wounds nor interdigital macerations noted. Toenails 2nd and 3rd toes b/l thick, discolored, elongated with subungual debris and pain on dorsal palpation. Anonychia noted bilateral great toes, bilateral 4th toes, and bilateral 5th toes. Nailbed(s) epithelialized.   Hyperkeratotic lesion(s) right heel, L hallux, R hallux, and submet head 1 right foot.  No erythema, no edema, no drainage, no fluctuance.  Musculoskeletal Examination: Muscle strength 5/5 to b/l LE.  No pain, crepitus noted b/l. No gross pedal deformities. Patient ambulates independently without assistive aids.   Radiographs: None  Assessment:   1.  Pain due to onychomycosis of toenails of both feet   2. Callus   3. Diabetic peripheral neuropathy associated with type 2 diabetes mellitus (HCC)    Plan:  -Consent given for treatment as described below: -Examined patient. -Continue foot and shoe inspections daily. Monitor blood glucose per PCP/Endocrinologist's recommendations. -Toenails were debrided in length and girth bilateral 2nd toes and bilateral 3rd toes with sterile nail nippers and dremel without iatrogenic bleeding.  -Callus(es) right heel, L hallux, R hallux, and submet head 1 right foot pared utilizing sterile scalpel blade without complication or incident. Total number debrided =4. -Patient/POA to call should there be question/concern in the interim.  Return in about 3 months (around 06/19/2023).  Freddie Breech, DPM      [facility] LOCATION: [address], [place] [address]                   Office [phone]   [facility] LOCATION: [address] [place], [place] [address] Office [phone]

## 2023-04-26 ENCOUNTER — Other Ambulatory Visit: Payer: Self-pay | Admitting: Internal Medicine

## 2023-04-26 DIAGNOSIS — Z1231 Encounter for screening mammogram for malignant neoplasm of breast: Secondary | ICD-10-CM

## 2023-05-16 ENCOUNTER — Encounter: Payer: Self-pay | Admitting: Emergency Medicine

## 2023-05-16 ENCOUNTER — Ambulatory Visit
Admission: EM | Admit: 2023-05-16 | Discharge: 2023-05-16 | Disposition: A | Discharge status: Home / Self Care | Payer: Medicare Other | Attending: Family Medicine | Admitting: Family Medicine

## 2023-05-16 DIAGNOSIS — I10 Essential (primary) hypertension: Secondary | ICD-10-CM | POA: Insufficient documentation

## 2023-05-16 DIAGNOSIS — R509 Fever, unspecified: Secondary | ICD-10-CM | POA: Insufficient documentation

## 2023-05-16 DIAGNOSIS — B974 Respiratory syncytial virus as the cause of diseases classified elsewhere: Secondary | ICD-10-CM | POA: Diagnosis present

## 2023-05-16 DIAGNOSIS — J069 Acute upper respiratory infection, unspecified: Secondary | ICD-10-CM | POA: Insufficient documentation

## 2023-05-16 LAB — RESP PANEL BY RT-PCR (RSV, FLU A&B, COVID)  RVPGX2
Influenza A by PCR: NEGATIVE
Influenza B by PCR: NEGATIVE
Resp Syncytial Virus by PCR: POSITIVE — AB
SARS Coronavirus 2 by RT PCR: NEGATIVE

## 2023-05-16 LAB — GROUP A STREP BY PCR: Group A Strep by PCR: NOT DETECTED

## 2023-05-16 MED ORDER — IBUPROFEN 800 MG PO TABS
800.0000 mg | ORAL_TABLET | Freq: Once | ORAL | Status: AC
  Administered 2023-05-16: 800 mg via ORAL

## 2023-05-16 NOTE — Discharge Instructions (Addendum)
Rachel [name] COVID, strep and influenza test were all negative however she has RSV (respiratory syncytial virus).  See handout for more information.  Continue providing Tylenol and/or ibuprofen as needed to combat fever, body aches and headache.  It is important that you monitor oxygen saturation closely while she recovers from RSV.

## 2023-05-16 NOTE — ED Provider Notes (Addendum)
[facility]    CSN: [id number] Arrival date & time: 05/16/23  1334      History   Chief Complaint Chief Complaint  Patient presents with   Cough   Nasal Congestion   Sore Throat    HPI Rachel [name] is a 63 y.o. female.   HPI  History obtained from the patient and caregiver . [name] presents for cough, nasal congestion, rhinorrhea and sore throat that started yesterday. Caregiver unsure of fever.  Pt denies headache, ear pain, vomiting and diarrhea.  No treatments prior to arrival        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   Pain due to onychomycosis of toenails of both feet 02/02/2019   Acute gastroenteritis 01/26/2018   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   Aggression 07/13/2015   Palpitations 07/06/2015   Essential hypertension, benign 07/06/2015   Obesity 07/06/2015   Cerebral palsy (HCC) 12/24/2013   Controlled type 2 diabetes mellitus without complication (HCC) 12/24/2013   HLD (hyperlipidemia) 12/24/2013   BP (high blood pressure) 12/24/2013   Adiposity 12/24/2013   Peripheral nerve disease 12/24/2013    Past Surgical History:  Procedure Laterality Date   DILATION AND CURETTAGE OF UTERUS      OB History   No obstetric history on file.      Home Medications    Prior to Admission medications   Medication Sig Start Date End Date Taking? Authorizing Provider  ARIPiprazole (ABILIFY) 20 MG tablet Take 20 mg by mouth daily.     [provider]  carbamazepine (TEGRETOL) 200 MG tablet Take 400 mg by mouth 2 (two) times daily.     [provider]  fluticasone (FLONASE) 50 MCG/ACT nasal spray Place 1 spray into both nostrils daily.    [provider]  glipiZIDE (GLUCOTROL XL) 10 MG 24 hr tablet Take 10 mg by  mouth daily with breakfast.  09/20/14   [provider]  Insulin Glargine (LANTUS SOLOSTAR) 100 UNIT/ML Solostar Pen Inject 30 Units into the skin at bedtime.     [provider]  loratadine (CLARITIN) 10 MG tablet Take 10 mg by mouth daily.  09/21/14   [provider]  magnesium oxide (MAG-OX) 400 MG tablet Take 400 mg by mouth daily.    [provider]  medroxyPROGESTERone (DEPO-PROVERA) 150 MG/ML injection Inject 150 mg into the muscle every 3 (three) months.    [provider]  metFORMIN (GLUCOPHAGE) 1000 MG tablet Take 1,000 mg by mouth 2 (two) times daily with a meal.  02/21/15   [provider]  Multiple Vitamin (MULTIVITAMIN WITH MINERALS) TABS tablet Take 1 tablet by mouth daily.    [provider]  naproxen (NAPROSYN) 500 MG tablet Take 1 tablet by mouth 2 (two) times daily as needed. 07/12/22   [provider]  nystatin (MYCOSTATIN) 100000 UNIT/ML suspension Take 5 mLs by mouth 4 (four) times daily.    [provider]  ondansetron (ZOFRAN) 4 MG tablet Take 1 tablet (4 mg total) by mouth every 6 (six) hours as needed for nausea. 01/27/18   Mayo, Allyn Kenner, MD  ondansetron (ZOFRAN) 4 MG tablet Take 1 tablet by mouth every 6 (six) hours as needed. Patient not taking: Reported on 12/20/2022 07/12/22   [provider]  pioglitazone (ACTOS) 15 MG tablet Take 15 mg by mouth daily.     [provider]  polyethylene glycol (MIRALAX / GLYCOLAX) packet Take 17 g by mouth daily as needed for moderate constipation.    [provider]  potassium chloride SA (K-DUR,KLOR-CON) 20 MEQ tablet Take 20 mEq by mouth 2 (two) times daily.  04/13/14   [provider]  pravastatin (PRAVACHOL) 80 MG tablet Take 80 mg by mouth at bedtime.     [provider]  ranitidine (ZANTAC) 150 MG tablet Take 150 mg by mouth 2 (two) times daily.    [provider]  senna-docusate (SENOKOT-S) 8.6-50 MG  tablet Take 1 tablet by mouth 2 (two) times daily.    [provider]  traZODone (DESYREL) 50 MG tablet Take 50 mg by mouth at bedtime.    [provider]    Family History Family History  Problem Relation Age of Onset   Breast cancer Neg Hx     Social History Social History   Tobacco Use   Smoking status: Never   Smokeless tobacco: Never  Vaping Use   Vaping status: Never Used  Substance Use Topics   Alcohol use: No    Alcohol/week: 0.0 standard drinks of alcohol   Drug use: No     Allergies   Ace inhibitors   Review of Systems Review of Systems: negative unless otherwise stated in HPI.      Physical Exam Triage Vital Signs ED Triage Vitals  Encounter Vitals Group     BP      Systolic BP Percentile      Diastolic BP Percentile      Pulse      Resp      Temp      Temp src      SpO2      Weight      Height      Head Circumference      Peak Flow      Pain Score      Pain Loc      Pain Education      Exclude from Growth Chart    No data found.  Updated Vital Signs BP (!) 158/70 (BP Location: Right Arm)   Pulse 78   Temp (!) 101.5 F (38.6 C) (Oral)   Resp 18   SpO2 95%   Visual Acuity Right Eye Distance:   Left Eye Distance:   Bilateral Distance:    Right Eye Near:   Left Eye Near:    Bilateral Near:     Physical Exam GEN:     alert, non-toxic appearing female in no distress    HENT:  mucus membranes moist, oropharyngeal without lesions or erythema, no tonsillar hypertrophy or exudates, clear nasal discharge EYES:   no scleral injection or discharge NECK:  no lymphadenopathy, no meningismus   RESP:  no increased work of breathing, clear to auscultation bilaterally CVS:   regular rate and rhythm Skin:   warm and dry    UC Treatments / Results  Labs (all labs ordered are listed, but only abnormal results are displayed) Labs Reviewed  RESP PANEL BY RT-PCR (RSV, FLU A&B, COVID)  [id number] - Abnormal; Notable for the  following components:      Result Value   Resp Syncytial Virus by PCR POSITIVE (*)    All other components within normal limits  GROUP A STREP BY PCR    EKG   Radiology  No results found.  Procedures Procedures (including critical care time)  Medications Ordered in UC Medications  ibuprofen (ADVIL) tablet 800 mg (800 mg Oral Given 05/16/23 1501)    Initial Impression / Assessment and Plan / UC Course  I have reviewed the triage vital signs and the nursing notes.  Pertinent labs & imaging results that were available during my care of the patient were reviewed by me and considered in my medical decision making (see chart for details).       Pt is a 63 y.o. female who presents for 1 day of respiratory symptoms. Ziyonna is febrile here at 101.12F.  Motrin given. Satting 95% on room air. She is hypertensive at 176/84 and repeat 20 minutes later was 158/70.   Overall pt is non-toxic appearing, well hydrated, without respiratory distress. Pulmonary exam is unremarkable. Strep PCR is negative.  COVID, RSV and influenza panel obtained and was RSV positive. Discussed symptomatic treatment with OTC medications: Tussin, Tylenol, Motrin, cough drops, Claritin.  Explained lack of efficacy of antibiotics in viral disease.  Typical duration of symptoms discussed.   Return and ED precautions given and voiced understanding. Discussed MDM, treatment plan and plan for follow-up with patient and her caregiver who agree with plan.     Final Clinical Impressions(s) / UC Diagnoses   Final diagnoses:  Acute URI due to respiratory syncytial virus (RSV)  Elevated blood pressure reading with diagnosis of hypertension  Fever, unspecified     Discharge Instructions      Ms. Wentling's COVID, strep and influenza test were all negative however she has RSV (respiratory syncytial virus).  See handout for more information.  Continue providing Tylenol and/or ibuprofen as needed to combat fever, body aches and  headache.  It is important that you monitor oxygen saturation closely while she recovers from RSV.      ED Prescriptions   None    PDMP not reviewed this encounter.      Katha Cabal, DO 05/16/23 1900

## 2023-05-16 NOTE — ED Triage Notes (Signed)
Pt presents with a cough, runny nose and sore throat since yesterday.

## 2023-05-18 ENCOUNTER — Emergency Department: Payer: Medicare Other

## 2023-05-18 ENCOUNTER — Emergency Department
Admission: EM | Admit: 2023-05-18 | Discharge: 2023-05-18 | Disposition: A | Discharge status: Home / Self Care | Payer: Medicare Other | Attending: Emergency Medicine | Admitting: Emergency Medicine

## 2023-05-18 ENCOUNTER — Other Ambulatory Visit: Payer: Self-pay

## 2023-05-18 DIAGNOSIS — I10 Essential (primary) hypertension: Secondary | ICD-10-CM | POA: Diagnosis not present

## 2023-05-18 DIAGNOSIS — R55 Syncope and collapse: Secondary | ICD-10-CM | POA: Insufficient documentation

## 2023-05-18 DIAGNOSIS — E119 Type 2 diabetes mellitus without complications: Secondary | ICD-10-CM | POA: Diagnosis not present

## 2023-05-18 DIAGNOSIS — R059 Cough, unspecified: Secondary | ICD-10-CM | POA: Insufficient documentation

## 2023-05-18 LAB — URINALYSIS, ROUTINE W REFLEX MICROSCOPIC
Bilirubin Urine: NEGATIVE
Glucose, UA: NEGATIVE mg/dL
Hgb urine dipstick: NEGATIVE
Ketones, ur: NEGATIVE mg/dL
Leukocytes,Ua: NEGATIVE
Nitrite: NEGATIVE
Protein, ur: NEGATIVE mg/dL
Specific Gravity, Urine: 1.006 (ref 1.005–1.030)
pH: 6 (ref 5.0–8.0)

## 2023-05-18 LAB — BASIC METABOLIC PANEL
Anion gap: 15 (ref 5–15)
BUN: 11 mg/dL (ref 8–23)
CO2: 26 mmol/L (ref 22–32)
Calcium: 9 mg/dL (ref 8.9–10.3)
Chloride: 93 mmol/L — ABNORMAL LOW (ref 98–111)
Creatinine, Ser: 0.91 mg/dL (ref 0.44–1.00)
GFR, Estimated: >60 mL/min (ref >60)
Glucose, Bld: 230 mg/dL — ABNORMAL HIGH (ref 70–99)
Potassium: 3.6 mmol/L (ref 3.5–5.1)
Sodium: 134 mmol/L — ABNORMAL LOW (ref 135–145)

## 2023-05-18 LAB — CBC
HCT: 37.3 % (ref 36.0–46.0)
Hemoglobin: 11.8 g/dL — ABNORMAL LOW (ref 12.0–15.0)
MCH: 30.6 pg (ref 26.0–34.0)
MCHC: 31.6 g/dL (ref 30.0–36.0)
MCV: 96.6 fL (ref 80.0–100.0)
Platelets: 165 10*3/uL (ref 150–400)
RBC: 3.86 MIL/uL — ABNORMAL LOW (ref 3.87–5.11)
RDW: 11.8 % (ref 11.5–15.5)
WBC: 4.8 10*3/uL (ref 4.0–10.5)
nRBC: 0 % (ref 0.0–0.2)

## 2023-05-18 MED ORDER — SODIUM CHLORIDE 0.9 % IV BOLUS
1000.0000 mL | Freq: Once | INTRAVENOUS | Status: AC
  Administered 2023-05-18: 1000 mL via INTRAVENOUS

## 2023-05-18 NOTE — ED Provider Notes (Signed)
[facility] Provider Note    Event Date/Time   First MD Initiated Contact with Patient 05/18/23 1125     (approximate)   History   Loss of Consciousness   HPI  Rachel [name] is a 63 y.o. female with history of CP, diabetes, hypertension, and hypocholesterolemia who presents with syncope per EMS the patient was at her group home and passed out after standing up and feeling dizzy.  The patient states that she was going to go to the bathroom when she stood up, started to feel very lightheaded, and then passed out.  She states that she still feels somewhat lightheaded.  She denies any acute pain.  She does report a cough.  She denies feeling short of breath.  I reviewed the past medical records.  The patient was seen in urgent care on 2/13 with respiratory symptoms and fever.  She was positive for RSV at that time but was stable for discharge.   Physical Exam   Triage Vital Signs: ED Triage Vitals  Encounter Vitals Group     BP 05/18/23 1057 121/64     Systolic BP Percentile --      Diastolic BP Percentile --      Pulse Rate 05/18/23 1057 (!) 55     Resp 05/18/23 1057 17     Temp 05/18/23 1057 98.8 F (37.1 C)     Temp src --      SpO2 05/18/23 1057 100 %     Weight 05/18/23 1058 200 lb (90.7 kg)     Height 05/18/23 1058 5\' 7"  (1.702 m)     Head Circumference --      Peak Flow --      Pain Score 05/18/23 1058 0     Pain Loc --      Pain Education --      Exclude from Growth Chart --     Most recent vital signs: Vitals:   05/18/23 1057  BP: 121/64  Pulse: (!) 55  Resp: 17  Temp: 98.8 F (37.1 C)  SpO2: 100%    General: Alert, comfortable appearing, no distress.  CV:  Good peripheral perfusion.  Resp:  Normal effort.  Lungs CTAB. Abd:  No distention.  Other:  EOMI.  PERRLA.  Normal speech.  No facial droop.  Motor intact in all extremities.  Slightly dry mucous membranes.   ED Results / Procedures / Treatments   Labs (all labs  ordered are listed, but only abnormal results are displayed) Labs Reviewed  BASIC METABOLIC PANEL - Abnormal; Notable for the following components:      Result Value   Sodium 134 (*)    Chloride 93 (*)    Glucose, Bld 230 (*)    All other components within normal limits  CBC - Abnormal; Notable for the following components:   RBC 3.86 (*)    Hemoglobin 11.8 (*)    All other components within normal limits  URINALYSIS, ROUTINE W REFLEX MICROSCOPIC - Abnormal; Notable for the following components:   Color, Urine YELLOW (*)    APPearance HAZY (*)    All other components within normal limits     EKG  ED ECG REPORT I, Dionne Bucy, the attending physician, personally viewed and interpreted this ECG.  Date: 05/18/2023 EKG Time: 1059 Rate: 55 Rhythm: normal sinus rhythm QRS Axis: normal Intervals: normal ST/T Wave abnormalities: normal Narrative Interpretation: no evidence of acute ischemia    RADIOLOGY  CT head/C-spine: Pending  PROCEDURES:  Critical Care performed: No  Procedures   MEDICATIONS ORDERED IN ED: Medications  sodium chloride 0.9 % bolus 1,000 mL (1,000 mLs Intravenous New Bag/Given 05/18/23 1443)     IMPRESSION / MDM / ASSESSMENT AND PLAN / ED COURSE  I reviewed the triage vital signs and the nursing notes.  63 year old female with PMH as noted above presents with an apparent syncopal episode with prodrome of lightheadedness.  The patient states she still feels somewhat lightheaded.  She was diagnosed with RSV a few days ago.  She has no significant acute respiratory symptoms.  On exam her vital signs are normal.  Lungs are clear to auscultation.  Mucous membranes are dry.  Neurologic exam is nonfocal.  Differential diagnosis includes, but is not limited to, vasovagal episode, dehydration, RSV or other acute infection, electrolyte abnormality, other metabolic cause, less likely cardiac or CNS etiology.  We will obtain CT head, lab workup, give a  fluid bolus, and reassess.  Patient's presentation is most consistent with acute presentation with potential threat to life or bodily function.  The patient is on the cardiac monitor to evaluate for evidence of arrhythmia and/or significant heart rate changes.   ----------------------------------------- 3:40 PM on 05/18/2023 -----------------------------------------  BMP and CBC are unremarkable.  Urinalysis shows no evidence of UTI.  CT is pending.  If it is negative anticipate discharge back to her facility.  I signed the patient out to the oncoming ED physician Dr. Rosalia Hammers.  FINAL CLINICAL IMPRESSION(S) / ED DIAGNOSES   Final diagnoses:  Syncope, unspecified syncope type     Rx / DC Orders   ED Discharge Orders     None        Note:  This document was prepared using Dragon voice recognition software and may include unintentional dictation errors.    Dionne Bucy, MD 05/18/23 [phone]

## 2023-05-18 NOTE — ED Triage Notes (Signed)
Pt to ED [facility] from group home on [address] for syncopal episode today after standing up and feeling dizziness. Initially hypotensive with EMS, systolic BP 70's, given 500 NS PTA Pt alert on arrival.  Attempted to contact legal guardian listed in chart at number provided, call unable to go through

## 2023-05-18 NOTE — ED Provider Notes (Signed)
Care of this patient assumed from prior physician at 1500 pending CT and disposition. Please see prior physician note for further details.  Briefly this is a 63 year old female who presented after a syncopal episode with preceding lightheadedness.  Labs were without critical derangement.  Received a liter of IV fluid.  Signed out to me pending CT imaging of her head and C-spine.  If these were reassuring, plan for discharge.  CT head and C-spine fortunately without acute traumatic injuries.  Patient reevaluated and updated on results of her workup.  She is comfortable with discharge home.  Strict return precautions provided.  Patient discharged in stable condition.   Trinna Post, MD 05/18/23 [phone]

## 2023-05-18 NOTE — ED Notes (Signed)
Patient discharged from ED by provider. Discharge instructions reviewed with patient and legal guardian and all questions answered. Patient ambulatory from ED in NAD and into care of group  home staff.

## 2023-05-18 NOTE — ED Notes (Signed)
Called [phone], spoke to Ysidro Evert, VP of Group Home division. [facility] will send someone to pick patient up.  Group home address is [address] in [place].

## 2023-05-18 NOTE — Discharge Instructions (Signed)
You were seen in the emergency department after passing out (syncope). Please arrange follow-up with a primary care provider in  for further evaluation of your symptoms. Return to the ER if you develop chest pain, shortness of breath, it feels like your heart is racing, repeated episodes, or other new or concerning symptoms.

## 2023-05-26 ENCOUNTER — Other Ambulatory Visit: Payer: Self-pay

## 2023-05-26 ENCOUNTER — Emergency Department
Admission: EM | Admit: 2023-05-26 | Discharge: 2023-05-26 | Disposition: A | Discharge status: Home / Self Care | Payer: Medicare Other | Attending: Emergency Medicine | Admitting: Emergency Medicine

## 2023-05-26 ENCOUNTER — Emergency Department: Payer: Medicare Other

## 2023-05-26 DIAGNOSIS — E1165 Type 2 diabetes mellitus with hyperglycemia: Secondary | ICD-10-CM | POA: Diagnosis not present

## 2023-05-26 DIAGNOSIS — M5020 Other cervical disc displacement, unspecified cervical region: Secondary | ICD-10-CM | POA: Insufficient documentation

## 2023-05-26 DIAGNOSIS — E871 Hypo-osmolality and hyponatremia: Secondary | ICD-10-CM | POA: Diagnosis not present

## 2023-05-26 DIAGNOSIS — I6782 Cerebral ischemia: Secondary | ICD-10-CM | POA: Diagnosis not present

## 2023-05-26 DIAGNOSIS — E876 Hypokalemia: Secondary | ICD-10-CM | POA: Insufficient documentation

## 2023-05-26 DIAGNOSIS — T50905A Adverse effect of unspecified drugs, medicaments and biological substances, initial encounter: Secondary | ICD-10-CM | POA: Insufficient documentation

## 2023-05-26 DIAGNOSIS — I10 Essential (primary) hypertension: Secondary | ICD-10-CM | POA: Diagnosis not present

## 2023-05-26 DIAGNOSIS — R42 Dizziness and giddiness: Secondary | ICD-10-CM | POA: Diagnosis present

## 2023-05-26 LAB — URINALYSIS, COMPLETE (UACMP) WITH MICROSCOPIC
Bilirubin Urine: NEGATIVE
Glucose, UA: >=500 mg/dL — AB
Hgb urine dipstick: NEGATIVE
Ketones, ur: NEGATIVE mg/dL
Leukocytes,Ua: NEGATIVE
Nitrite: NEGATIVE
Protein, ur: NEGATIVE mg/dL
Specific Gravity, Urine: 1.01 (ref 1.005–1.030)
pH: 7 (ref 5.0–8.0)

## 2023-05-26 LAB — CBC WITH DIFFERENTIAL/PLATELET
Abs Immature Granulocytes: 0.03 10*3/uL (ref 0.00–0.07)
Basophils Absolute: 0 10*3/uL (ref 0.0–0.1)
Basophils Relative: 0 %
Eosinophils Absolute: 0 10*3/uL (ref 0.0–0.5)
Eosinophils Relative: 0 %
HCT: 34.9 % — ABNORMAL LOW (ref 36.0–46.0)
Hemoglobin: 11.8 g/dL — ABNORMAL LOW (ref 12.0–15.0)
Immature Granulocytes: 0 %
Lymphocytes Relative: 22 %
Lymphs Abs: 1.5 10*3/uL (ref 0.7–4.0)
MCH: 30.9 pg (ref 26.0–34.0)
MCHC: 33.8 g/dL (ref 30.0–36.0)
MCV: 91.4 fL (ref 80.0–100.0)
Monocytes Absolute: 0.7 10*3/uL (ref 0.1–1.0)
Monocytes Relative: 11 %
Neutro Abs: 4.6 10*3/uL (ref 1.7–7.7)
Neutrophils Relative %: 67 %
Platelets: 282 10*3/uL (ref 150–400)
RBC: 3.82 MIL/uL — ABNORMAL LOW (ref 3.87–5.11)
RDW: 11.6 % (ref 11.5–15.5)
WBC: 6.9 10*3/uL (ref 4.0–10.5)
nRBC: 0 % (ref 0.0–0.2)

## 2023-05-26 LAB — COMPREHENSIVE METABOLIC PANEL
ALT: 23 U/L (ref 0–44)
AST: 21 U/L (ref 15–41)
Albumin: 3.5 g/dL (ref 3.5–5.0)
Alkaline Phosphatase: 86 U/L (ref 38–126)
Anion gap: 12 (ref 5–15)
BUN: 12 mg/dL (ref 8–23)
CO2: 27 mmol/L (ref 22–32)
Calcium: 9 mg/dL (ref 8.9–10.3)
Chloride: 92 mmol/L — ABNORMAL LOW (ref 98–111)
Creatinine, Ser: 0.81 mg/dL (ref 0.44–1.00)
GFR, Estimated: >60 mL/min (ref >60)
Glucose, Bld: 286 mg/dL — ABNORMAL HIGH (ref 70–99)
Potassium: 3.4 mmol/L — ABNORMAL LOW (ref 3.5–5.1)
Sodium: 131 mmol/L — ABNORMAL LOW (ref 135–145)
Total Bilirubin: 0.4 mg/dL (ref 0.0–1.2)
Total Protein: 7.5 g/dL (ref 6.5–8.1)

## 2023-05-26 LAB — TROPONIN I (HIGH SENSITIVITY)
Troponin I (High Sensitivity): 12 ng/L (ref <18)
Troponin I (High Sensitivity): 14 ng/L (ref <18)

## 2023-05-26 LAB — RESP PANEL BY RT-PCR (RSV, FLU A&B, COVID)  RVPGX2
Influenza A by PCR: NEGATIVE
Influenza B by PCR: NEGATIVE
Resp Syncytial Virus by PCR: NEGATIVE
SARS Coronavirus 2 by RT PCR: NEGATIVE

## 2023-05-26 MED ORDER — AMOXICILLIN-POT CLAVULANATE 875-125 MG PO TABS: 1.0000 | ORAL_TABLET | Freq: Two times a day (BID) | ORAL | 0 refills | Status: AC

## 2023-05-26 NOTE — ED Notes (Addendum)
 Nurse secretary provided RN with Number and information of group home that will come and pick her up when discharge is appropriate.  "[name]" [phone] or [phone]

## 2023-05-26 NOTE — ED Notes (Signed)
 Patient returned from MRI.

## 2023-05-26 NOTE — ED Notes (Signed)
Patient transporting to MRI 

## 2023-05-26 NOTE — ED Provider Notes (Addendum)
 [facility] Provider Note    Event Date/Time   First MD Initiated Contact with Patient 05/26/23 1041     (approximate)   History   Altered Mental Status   HPI  Rachel [name] is a 63 y.o. female  with diabetes, hypertension, hypercholesterolemia who comes in with concerns for dizziness.  Patient reports dizziness has been ongoing for the past week.  She reports that is somewhat lightheaded somewhat room is spinning.  She states it does occur at rest.  On review of records she was seen here on 2/15 for loss of consciousness and had CT imaging that was negative received a liter of fluid and was discharged back to facility.  Pt got 1L fluid with EMS  Patient noted be hypotensive with EMS.  Physical Exam   Triage Vital Signs: ED Triage Vitals [05/26/23 1027]  Encounter Vitals Group     BP (!) 123/59     Systolic BP Percentile      Diastolic BP Percentile      Pulse Rate 65     Resp (!) 22     Temp (!) 97.5 F (36.4 C)     Temp Source Oral     SpO2 100 %     Weight      Height      Head Circumference      Peak Flow      Pain Score 2     Pain Loc      Pain Education      Exclude from Growth Chart     Most recent vital signs: Vitals:   05/26/23 1027  BP: (!) 123/59  Pulse: 65  Resp: (!) 22  Temp: (!) 97.5 F (36.4 C)  SpO2: 100%     General: Awake, no distress.  CV:  Good peripheral perfusion.  Resp:  Normal effort.  Abd:  No distention.  Soft and nontender Other:  Finger-to-nose intact.  Equal strength in arms and legs.   ED Results / Procedures / Treatments   Labs (all labs ordered are listed, but only abnormal results are displayed) Labs Reviewed  COMPREHENSIVE METABOLIC PANEL  CBC WITH DIFFERENTIAL/PLATELET  URINALYSIS, COMPLETE (UACMP) WITH MICROSCOPIC     EKG  My interpretation of EKG:  Normal sinus rhythm 95 without any ST elevation or T wave inversions except for lead III, normal intervals  EKG is sinus rate  of 54 without any ST elevation or T wave inversions  RADIOLOGY MRI negative   PROCEDURES:  Critical Care performed: No  Procedures   MEDICATIONS ORDERED IN ED: Medications - No data to display   IMPRESSION / MDM / ASSESSMENT AND PLAN / ED COURSE  I reviewed the triage vital signs and the nursing notes.   Patient's presentation is most consistent with acute presentation with potential threat to life or bodily function.   Given patient's intellectual disability patient is a poor historian on the dizziness.  She is now coming back a second time for dizziness.  There was some concern from EMS that she might be on blood pressure medicines and her blood pressure was very low with them and patient was given some fluids orthostatics here are negative but patient already received fluids of almost a liter.  The list of medications that we got were very different than what the EMS told us.  We will try to call over to the group home to ensure that she is not on any blood pressure medications.  Labs ordered evaluate for Electra otherwise, AKI, troponin evaluate for ACS.  I do not feel a repeat CT imaging is warranted given she did not fall and hit her head as she did last time and CT head a few days ago was reassuring however I will get an MRI given difficulty getting an exact story outpatients of her dizziness just to ensure there was not a stroke given she is now coming back a second time.  Trop negative x2 Low NA 131 CBC stable hemoglobin  UA no evidence of UTI  Glucose elevated but no DKA    1. No evidence of an acute intracranial abnormality. 2. Mild-to-moderate for age T2 FLAIR hyperintense signal changes within the cerebral white matter, nonspecific but most often secondary to chronic small vessel ischemia. 3. Paranasal sinus disease as described. 4. Incompletely assessed cervical spondylosis. At C3-C4, a central disc protrusion contributes to apparent moderate spinal canal stenosis  (with flattening of the ventral spinal cord).   I reassessed patient she is got no numbness or tingling in her arms or legs.  She is got great grip strength is nearly able to pull herself up off the bed with holding onto my hands.  She is got no weakness in her leg she has been up and ambulatory.  I did discuss this incidental finding on the MRI with Dr. Katrinka Blazing from neurosurgery and given this was found incidentally can follow-up outpatient in their clinic.  We were able to finally get a hold of the group home and find out her home medications.  She is on hydrochlorothiazide 25, Lasix 20, losartan, diltiazem, hydralazine 100 twice daily.  I suspect that this is causing patient's dehydration.  She is got no edema noted on her legs.  I reviewed the note where patient was last seen 9.12 Seen on 9/12 by cardiology Dr Juliann Pares where this was her current regimen.  Given no swelling I will recommend that they hold at least the Lasix and the hydrochlorothiazide due to her slight hypokalemia, hypochloride and they can monitor her swelling.  If they start to come back they would be to put back on the Lasix.  They need to call a primary care doctor to make a follow-up appointment to discuss her blood pressure medications but I suspect that her chronic dizziness is related to being over diuresed, over her blood pressure medication  Patient's heart rates have trended a little bit lower than when she first got here but a repeat EKG continues to be sinus and appears sinus on the monitor.  I reviewed a prior note from 02/20/2023 was heart rate was 57  Attempted to call legal gaurdian and updated them court appointmented.   The patient is on the cardiac monitor to evaluate for evidence of arrhythmia and/or significant heart rate changes.  Clinical Course as of 05/26/23 1139  Sun May 26, 2023  1125 CT HEAD WO CONTRAST ( ) [BK]  1126 EKG 12-Lead [BK]    Clinical Course User Index [BK] Rachel [name], [place],  [place]     FINAL CLINICAL IMPRESSION(S) / ED DIAGNOSES   Final diagnoses:  Dizziness  Hyponatremia  Adverse effect of drug, initial encounter  Herniated disc, cervical     Rx / DC Orders   ED Discharge Orders     None        Note:  This document was prepared using Dragon voice recognition software and may include unintentional dictation errors.   Concha Se, MD 05/26/23 [phone]  Concha Se, MD 05/26/23 [phone]

## 2023-05-26 NOTE — ED Notes (Addendum)
 Called Legal guardian [place] [employer] psych[employer].  Patient contact information updated.

## 2023-05-26 NOTE — Discharge Instructions (Addendum)
 Patient is on multiple blood pressure medications that I suspect is making her blood pressure low and making her feel dizzy.  I would start off with holding her hydrochlorothiazide and her Lasix.  These are both causing her sodium and chloride levels to be low.  You should monitor her blood pressures and if her blood pressure start going significantly elevated over 160 or she should start noticing some swelling in her legs then you could restart the Lasix.  You should call her primary care doctor and try to get a ER follow-up visit as soon as possible next week to discuss her blood pressure medication.  We did do an MRI today that did not show any signs of stroke.  Did have an incidental noted herniated disc which I discussed with neurosurgery.  You should call them to make a follow-up appointment for this and if she ever develops any weakness or numbness in her arm or leg you would have her return to the ER emergently  Pt started on augmentns for possible sinus infection.   IMPRESSION: 1. No evidence of an acute intracranial abnormality. 2. Mild-to-moderate for age T2 FLAIR hyperintense signal changes within the cerebral white matter, nonspecific but most often secondary to chronic small vessel ischemia. 3. Paranasal sinus disease as described. 4. Incompletely assessed cervical spondylosis. At C3-C4, a central disc protrusion contributes to apparent moderate spinal canal stenosis (with flattening of the ventral spinal cord).

## 2023-05-26 NOTE — ED Triage Notes (Signed)
 Patient from [name]. Patient has recently been treated for UTI. Antibiotics for 4xdays. Patient has dark cloudy urine. Patient states she has felt dizzy for a week. EDP at bedside assessing patient.

## 2023-06-07 ENCOUNTER — Other Ambulatory Visit: Payer: Self-pay

## 2023-06-07 DIAGNOSIS — M4802 Spinal stenosis, cervical region: Secondary | ICD-10-CM

## 2023-06-07 NOTE — Progress Notes (Signed)
 Referring Physician:  Gracelyn Nurse, MD [address] [facility] [place],  [place] [address]  Primary Physician:  Gracelyn Nurse, MD  History of Present Illness: 06/10/2023 Ms. Rachel [name] is here today with a history of cerebral palsy, moderate intellectual disability, diabetes, high cholesterol and high blood pressure.  She comes today with a caregiver present.  She does say that she has neck pain, but denies any arm pain.  She denies any pain that goes down her arms.  She is unsure if she has any numbness or tingling in her arms, but states it may be present on her left upper extremity.  She denies any weakness.  She denies any bowel or bladder dysfunction or issues with her gait.   Duration: 2+ months   Weakness: none Timing: constant Bowel/Bladder Dysfunction: none  Conservative measures:  Physical therapy: has not participated in PT Multimodal medical therapy including regular antiinflammatories: tylenol, ibuprofen, naproxen Injections: no epidural steroid injections  Past Surgery: no spinal surgeries  Cecillia Menees has no symptoms of cervical myelopathy.  The symptoms are causing a significant impact on the patient's life.   Review of Systems:  A 10 point review of systems is negative, except for the pertinent positives and negatives detailed in the HPI.  Past Medical History: Past Medical History:  Diagnosis Date   Cerebral palsy (HCC)    Diabetes mellitus without complication (HCC)    Hypercholesteremia    Hypertension     Past Surgical History: Past Surgical History:  Procedure Laterality Date   DILATION AND CURETTAGE OF UTERUS      Allergies: Allergies as of 06/10/2023 - Review Complete 06/10/2023  Allergen Reaction Noted   Ace inhibitors Swelling 06/01/2015    Medications: Outpatient Encounter Medications as of 06/10/2023  Medication Sig   acetaminophen (TYLENOL) 325 MG tablet Take 325 mg by mouth every 6 (six) hours as needed.    Alcohol Swabs (ALCOHOL PREP) 70 % PADS Apply 1 each topically 3 (three) times daily.   ARIPiprazole (ABILIFY) 20 MG tablet Take 20 mg by mouth daily.    benzonatate (TESSALON) 100 MG capsule Take 100 mg by mouth 2 (two) times daily as needed for cough.   camphor-menthol (SARNA) lotion Apply 1 Application topically as needed for itching. ANTI-ITCH CREAM   carbamazepine (TEGRETOL) 200 MG tablet Take 200 mg by mouth 2 (two) times daily.   chlorhexidine (PERIDEX) 0.12 % solution Use as directed 5 mLs in the mouth or throat daily.   diltiazem (CARDIZEM CD) 360 MG 24 hr capsule Take 360 mg by mouth daily.   famotidine (PEPCID) 20 MG tablet Take 1 tablet by mouth 2 (two) times daily.   fluticasone (FLONASE) 50 MCG/ACT nasal spray Place 1 spray into both nostrils daily.   furosemide (LASIX) 20 MG tablet Take 1 tablet by mouth daily.   hydrALAZINE (APRESOLINE) 100 MG tablet Take 1 tablet by mouth 2 (two) times daily.   hydrochlorothiazide (HYDRODIURIL) 25 MG tablet Take 1 tablet by mouth daily.   ibuprofen (ADVIL) 800 MG tablet Take 800 mg by mouth every 6 (six) hours as needed.   Insulin Glargine (LANTUS SOLOSTAR) 100 UNIT/ML Solostar Pen Inject 20 Units into the skin at bedtime.   loratadine (CLARITIN) 10 MG tablet Take 10 mg by mouth daily.    losartan (COZAAR) 100 MG tablet Take 1 tablet by mouth daily.   magnesium oxide (MAG-OX) 400 MG tablet Take 400 mg by mouth daily.   metFORMIN (GLUCOPHAGE) 1000  MG tablet Take 1,000 mg by mouth 2 (two) times daily with a meal.    metoprolol tartrate (LOPRESSOR) 50 MG tablet Take 1 tablet by mouth 2 (two) times daily.   Multiple Vitamin (MULTIVITAMIN WITH MINERALS) TABS tablet Take 1 tablet by mouth daily.   naproxen (NAPROSYN) 500 MG tablet Take 1 tablet by mouth 2 (two) times daily as needed.   nystatin (MYCOSTATIN) 100000 UNIT/ML suspension Take 5 mLs by mouth 4 (four) times daily.   ondansetron (ZOFRAN) 4 MG tablet Take 1 tablet by mouth every 6 (six) hours  as needed.   pioglitazone (ACTOS) 15 MG tablet Take 15 mg by mouth daily.    polyethylene glycol (MIRALAX / GLYCOLAX) packet Take 17 g by mouth daily as needed for moderate constipation.   potassium chloride SA (K-DUR,KLOR-CON) 20 MEQ tablet Take 20 mEq by mouth 2 (two) times daily.    pravastatin (PRAVACHOL) 80 MG tablet Take 80 mg by mouth at bedtime.    prazosin (MINIPRESS) 2 MG capsule Take 1 tablet by mouth 2 (two) times daily.   senna-docusate (SENOKOT-S) 8.6-50 MG tablet Take 1 tablet by mouth 2 (two) times daily.   Sod Fluoride-Potassium Nitrate (FLUORIMAX 5000 SENSITIVE) 1.1-5 % GEL Place 1 Application onto teeth at bedtime.   traZODone (DESYREL) 50 MG tablet Take 50 mg by mouth at bedtime.   triamcinolone cream (KENALOG) 0.1 % Apply 1 Application topically 2 (two) times daily.   phenazopyridine (PYRIDIUM) 200 MG tablet Take 1 tablet (200 mg total) by mouth 3 (three) times daily.   [DISCONTINUED] glipiZIDE (GLUCOTROL XL) 10 MG 24 hr tablet Take 10 mg by mouth daily with breakfast.  (Patient not taking: Reported on 05/26/2023)   [DISCONTINUED] medroxyPROGESTERone (DEPO-PROVERA) 150 MG/ML injection Inject 150 mg into the muscle every 3 (three) months. (Patient not taking: Reported on 05/26/2023)   [DISCONTINUED] ondansetron (ZOFRAN) 4 MG tablet Take 1 tablet (4 mg total) by mouth every 6 (six) hours as needed for nausea.   [DISCONTINUED] ranitidine (ZANTAC) 150 MG tablet Take 150 mg by mouth 2 (two) times daily. (Patient not taking: Reported on 05/26/2023)   No facility-administered encounter medications on file as of 06/10/2023.    Social History: Social History   Tobacco Use   Smoking status: Never   Smokeless tobacco: Never  Vaping Use   Vaping status: Never Used  Substance Use Topics   Alcohol use: No    Alcohol/week: 0.0 standard drinks of alcohol   Drug use: No    Family Medical History: Family History  Problem Relation Age of Onset   Breast cancer Neg Hx     Physical  Examination: @VITALWITHPAIN @  General: Patient is well developed, well nourished, calm, collected, and in no apparent distress. Attention to examination is appropriate.  Psychiatric: Patient is non-anxious.  Head:  Pupils equal, round, and reactive to light.  ENT:  Oral mucosa appears well hydrated.  Neck:   Supple.  Full range of motion.  Respiratory: Patient is breathing without any difficulty.  Extremities: No edema.  Vascular: Palpable dorsal pedal pulses.  Skin:   On exposed skin, there are no abnormal skin lesions.  NEUROLOGICAL:     Patient has known intellectual disability.  She does have some difficulty processing and answering questions that she is asked.  However she was extremely pleasant during her visit today.  Cranial Nerves: Pupils equal round and reactive to light.  Facial tone is symmetric.     ROM of spine: Full range of motion.  No tenderness palpation of her cervical paraspinals.  Strength: Side Biceps Triceps Deltoid Interossei Grip Wrist Ext. Wrist Flex.  R 5 5 5 5 5 5 5   L 5 5 5 5 5 5 5    Reflexes are 2+ and symmetric at the biceps, triceps, brachioradialis.Hoffman's is absent.  Bilateral upper extremity sensation is intact to light touch.    Gait is normal.   No difficulty with tandem gait.   No evidence of dysmetria noted.  Medical Decision Making  Imaging: CT cervical spine 05/18/2023: CT CERVICAL SPINE WITHOUT CONTRAST   TECHNIQUE: Multidetector CT imaging of the cervical spine was performed without intravenous contrast. Multiplanar CT image reconstructions were also generated.   RADIATION DOSE REDUCTION: This exam was performed according to the departmental dose-optimization program which includes automated exposure control, adjustment of the mA and/or kV according to patient size and/or use of iterative reconstruction technique.   COMPARISON:  04/18/2019   FINDINGS: Alignment: Facet joints are aligned without dislocation or  traumatic listhesis. Dens and lateral masses are aligned.   Skull base and vertebrae: No acute fracture. No primary bone lesion or focal pathologic process.   Soft tissues and spinal canal: No prevertebral fluid or swelling. No visible canal hematoma.   Disc levels: Mild multilevel cervical spondylosis. Disc protrusion most pronounced at C3-C4.   Upper chest: Included lung apices are clear.   Other: Bilateral carotid atherosclerosis.   IMPRESSION: No acute fracture or traumatic listhesis of the cervical spine.   I have personally reviewed the images and agree with the above interpretation.  Assessment and Plan: Ms. Loncar is a pleasant 63 y.o. female is here today with a history of cerebral palsy, moderate intellectual disability, diabetes, high cholesterol and high blood pressure.  She comes today with a caregiver present.  She does say that she has neck pain, but denies any arm pain.  She denies any pain that goes down her arms.  She is unsure if she has any numbness or tingling in her arms, but states it may be present on her left upper extremity.  She denies any weakness.  She denies any bowel or bladder dysfunction or issues with her gait.  She has no tenderness to palpation of her cervical paraspinals.  The CT of her cervical spine did show multilevel cervical spondylosis with disc protrusion most pronounced at C3-4.  On examination she has full strength to bilateral upper extremities.  No Hoffmann sign present.  Pleasure to see patient in clinic today.  Discussed with patient how I do believe she would benefit from physical therapy.  Patient does not distribute her own pain medications, but I did write a note to the facility regarding scheduled Tylenol and prompting the patient for ibuprofen as she needs it since she may not necessarily bring this to the nurses attention.  Thank you for involving me in the care of this patient.   Joan Flores, PA-C Dept. of Neurosurgery

## 2023-06-09 ENCOUNTER — Ambulatory Visit
Admission: EM | Admit: 2023-06-09 | Discharge: 2023-06-09 | Disposition: A | Discharge status: Home / Self Care | Attending: Emergency Medicine | Admitting: Emergency Medicine

## 2023-06-09 DIAGNOSIS — R300 Dysuria: Secondary | ICD-10-CM | POA: Insufficient documentation

## 2023-06-09 DIAGNOSIS — R81 Glycosuria: Secondary | ICD-10-CM | POA: Diagnosis not present

## 2023-06-09 DIAGNOSIS — N898 Other specified noninflammatory disorders of vagina: Secondary | ICD-10-CM | POA: Insufficient documentation

## 2023-06-09 LAB — URINALYSIS, W/ REFLEX TO CULTURE (INFECTION SUSPECTED)
Bilirubin Urine: NEGATIVE
Glucose, UA: >=500 mg/dL — AB
Hgb urine dipstick: NEGATIVE
Ketones, ur: NEGATIVE mg/dL
Leukocytes,Ua: NEGATIVE
Nitrite: NEGATIVE
Protein, ur: NEGATIVE mg/dL
Specific Gravity, Urine: 1.015 (ref 1.005–1.030)
pH: 6.5 (ref 5.0–8.0)

## 2023-06-09 LAB — GLUCOSE, CAPILLARY: Glucose-Capillary: 309 mg/dL — ABNORMAL HIGH (ref 70–99)

## 2023-06-09 MED ORDER — PHENAZOPYRIDINE HCL 200 MG PO TABS: 200.0000 mg | ORAL_TABLET | Freq: Three times a day (TID) | ORAL | 0 refills | Status: DC

## 2023-06-09 NOTE — ED Provider Notes (Signed)
 [facility]    CSN: [id number] Arrival date & time: 06/09/23  1021      History   Chief Complaint No chief complaint on file.   HPI Rachel [name] is a 63 y.o. female.   HPI  63 year old female with past medical history significant for hypertension, hypercholesterolemia, diabetes, and several palsy presents with her caretaker for evaluation of burning with urination and burning in her vaginal area and itching and her rectal area that started last night.  No fever, nausea, vomiting, or blood in the urine.  No vaginal discharge.  Patient does endorse urinary urgency and frequency.  Denies sexual activity.  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   Pain due to onychomycosis of toenails of both feet 02/02/2019   Acute gastroenteritis 01/26/2018   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   Aggression 07/13/2015   Palpitations 07/06/2015   Essential hypertension, benign 07/06/2015   Obesity 07/06/2015   Cerebral palsy (HCC) 12/24/2013   Controlled type 2 diabetes mellitus without complication (HCC) 12/24/2013   HLD (hyperlipidemia) 12/24/2013   BP (high blood pressure) 12/24/2013   Adiposity 12/24/2013   Peripheral nerve disease 12/24/2013    Past Surgical History:  Procedure Laterality Date   DILATION AND CURETTAGE OF UTERUS      OB History   No obstetric history on file.      Home Medications    Prior to Admission medications   Medication Sig Start Date End Date Taking? Authorizing Provider  phenazopyridine (PYRIDIUM) 200 MG tablet Take 1 tablet (200 mg total) by mouth 3 (three) times daily. 06/09/23  Yes Becky Augusta, NP  acetaminophen (TYLENOL) 325 MG tablet Take 325 mg by mouth every 6 (six) hours as needed. 07/12/22   [provider]  ARIPiprazole  (ABILIFY) 20 MG tablet Take 20 mg by mouth daily.     [provider]  benzonatate (TESSALON) 100 MG capsule Take 100 mg by mouth 2 (two) times daily as needed for cough. 07/12/22   [provider]  carbamazepine (TEGRETOL) 200 MG tablet Take 200 mg by mouth 2 (two) times daily.    [provider]  chlorhexidine (PERIDEX) 0.12 % solution Use as directed 5 mLs in the mouth or throat daily. 05/23/23   [provider]  diltiazem (CARDIZEM CD) 360 MG 24 hr capsule Take 360 mg by mouth daily. 10/23/22   [provider]  famotidine (PEPCID) 20 MG tablet Take 1 tablet by mouth 2 (two) times daily. 12/19/22   [provider]  fluticasone (FLONASE) 50 MCG/ACT nasal spray Place 1 spray into both nostrils daily.    [provider]  furosemide (LASIX) 20 MG tablet Take 1 tablet by mouth daily. 10/23/22   [provider]  glipiZIDE (GLUCOTROL XL) 10 MG 24 hr tablet Take 10 mg by mouth daily with breakfast.  Patient not taking: Reported on 05/26/2023 09/20/14   [provider]  hydrALAZINE (APRESOLINE) 100 MG tablet Take 1 tablet by mouth 2 (two) times daily. 10/23/22   [provider]  hydrochlorothiazide (HYDRODIURIL) 25 MG tablet Take 1 tablet by mouth daily. 05/22/22   [provider]  ibuprofen (ADVIL) 800 MG tablet Take 800 mg by mouth every 6 (six) hours as needed.    [provider]  Insulin Glargine (LANTUS SOLOSTAR) 100 UNIT/ML Solostar Pen Inject 20 Units into the skin at bedtime.    [provider]  loratadine (CLARITIN) 10 MG tablet Take 10 mg by mouth daily.  09/21/14   [provider]  losartan (COZAAR) 100 MG tablet Take 1 tablet by mouth daily. 10/23/22   [provider]  magnesium oxide (MAG-OX) 400 MG tablet Take 400 mg by mouth daily.    [provider]  medroxyPROGESTERone (DEPO-PROVERA) 150 MG/ML injection Inject 150 mg into the muscle every 3 (three)  months. Patient not taking: Reported on 05/26/2023    [provider]  metFORMIN (GLUCOPHAGE) 1000 MG tablet Take 1,000 mg by mouth 2 (two) times daily with a meal.  02/21/15   [provider]  metoprolol tartrate (LOPRESSOR) 50 MG tablet Take 1 tablet by mouth 2 (two) times daily. 12/19/22   [provider]  Multiple Vitamin (MULTIVITAMIN WITH MINERALS) TABS tablet Take 1 tablet by mouth daily.    [provider]  naproxen (NAPROSYN) 500 MG tablet Take 1 tablet by mouth 2 (two) times daily as needed. 07/12/22   [provider]  nystatin (MYCOSTATIN) 100000 UNIT/ML suspension Take 5 mLs by mouth 4 (four) times daily.    [provider]  ondansetron (ZOFRAN) 4 MG tablet Take 1 tablet (4 mg total) by mouth every 6 (six) hours as needed for nausea. 01/27/18   Mayo, Allyn Kenner, MD  ondansetron (ZOFRAN) 4 MG tablet Take 1 tablet by mouth every 6 (six) hours as needed. 07/12/22   [provider]  pioglitazone (ACTOS) 15 MG tablet Take 15 mg by mouth daily.     [provider]  polyethylene glycol (MIRALAX / GLYCOLAX) packet Take 17 g by mouth daily as needed for moderate constipation.    [provider]  potassium chloride SA (K-DUR,KLOR-CON) 20 MEQ tablet Take 20 mEq by mouth 2 (two) times daily.  04/13/14   [provider]  pravastatin (PRAVACHOL) 80 MG tablet Take 80 mg by mouth at bedtime.     [provider]  prazosin (MINIPRESS) 2 MG capsule Take 1 tablet by mouth 2 (two) times daily. 10/23/22   [provider]  ranitidine (ZANTAC) 150 MG tablet Take 150 mg by mouth 2 (two) times daily. Patient not taking: Reported on 05/26/2023    [provider]  senna-docusate (SENOKOT-S) 8.6-50 MG tablet Take 1 tablet by mouth 2 (two) times daily.    [provider]  Sod Fluoride-Potassium Nitrate (FLUORIMAX 5000 SENSITIVE) 1.1-5 % GEL Place 1 Application onto teeth at bedtime.    [provider]  traZODone (DESYREL) 50 MG tablet Take 50 mg by mouth at bedtime.    [provider]    Family History Family History  Problem Relation Age of Onset   Breast cancer Neg Hx     Social History Social History   Tobacco Use   Smoking status: Never   Smokeless tobacco: Never  Vaping Use   Vaping status: Never Used  Substance Use Topics   Alcohol use: No    Alcohol/week: 0.0 standard drinks of alcohol   Drug use: No     Allergies   Ace inhibitors   Review of Systems Review of Systems  Constitutional:  Negative for fever.  Genitourinary:  Positive for dysuria, frequency, pelvic pain and vaginal pain. Negative for hematuria.     Physical Exam Triage Vital Signs ED Triage Vitals  Encounter Vitals Group  BP      Systolic BP Percentile      Diastolic BP Percentile      Pulse      Resp      Temp      Temp src      SpO2      Weight      Height      Head Circumference      Peak Flow      Pain Score      Pain Loc      Pain Education      Exclude from Growth Chart    No data found.  Updated Vital Signs BP (!) 150/63 (BP Location: Left Arm)   Pulse 64   Temp 98.1 F (36.7 C) (Oral)   Resp 16   Wt 185 lb 9.6 oz (84.2 kg)   SpO2 99%   BMI 29.07 kg/m   Visual Acuity Right Eye Distance:   Left Eye Distance:   Bilateral Distance:    Right Eye Near:   Left Eye Near:    Bilateral Near:     Physical Exam Vitals and nursing note reviewed. Exam conducted with a chaperone present Fayrene Fearing, CMA.).  Constitutional:      Appearance: Normal appearance. She is not ill-appearing.  HENT:     Head: Normocephalic and atraumatic.  Genitourinary:    General: Normal vulva.     Vagina: No vaginal discharge.     Rectum: Normal.  Skin:    General: Skin is warm and dry.     Capillary Refill: Capillary refill takes less than 2 seconds.     Findings: No erythema.  Neurological:     General: No focal deficit present.     Mental Status:  She is alert and oriented to person, place, and time.      UC Treatments / Results  Labs (all labs ordered are listed, but only abnormal results are displayed) Labs Reviewed  URINALYSIS, W/ REFLEX TO CULTURE (INFECTION SUSPECTED) - Abnormal; Notable for the following components:      Result Value   Glucose, UA >=500 (*)    Bacteria, UA FEW (*)    All other components within normal limits  GLUCOSE, CAPILLARY - Abnormal; Notable for the following components:   Glucose-Capillary 309 (*)    All other components within normal limits  CBG MONITORING, ED  CERVICOVAGINAL ANCILLARY ONLY    EKG   Radiology No results found.  Procedures Procedures (including critical care time)  Medications Ordered in UC Medications - No data to display  Initial Impression / Assessment and Plan / UC Course  I have reviewed the triage vital signs and the nursing notes.  Pertinent labs & imaging results that were available during my care of the patient were reviewed by me and considered in my medical decision making (see chart for details).   Patient is a pleasant, nontoxic-appearing 63 year old female presenting for evaluation of genitourinary symptoms as outlined HPI above.  She is here with her caregiver and the caregiver reports that she first made mention of symptoms this morning when she woke up.  However, she did tell one of the caregivers that she slept with her gown and sheets open in the back because she was experiencing some itching.  With Fayrene Fearing, CMA as a chaperone I did inspect the patient's vulva and perineum.  There is no erythema, edema, or discharge noted in the vaginal introitus, on the vulva, on the perineum, or in the  [place] rectal area.  Differential diagnosis include urinary tract infection, vaginal yeast infection, bacterial vaginosis, skin yeast infection.  I will order a vaginal cytology swab to evaluate for the presence of BV and yeast.  Caregiver denies any knowledge of sexual  activity.  However, I will also test for gonorrhea, chlamydia, and trichomonas out of an abundance of caution.  I will also order urinalysis to evaluate for the presence of UTI.  Urinalysis shows greater than 500 glucose.  Negative for leukocyte esterase, nitrates, protein, or hemoglobin.  Reflex microscopy is unremarkable and only shows few bacteria.  I will order a fingerstick blood sugar as I do not see the patient on any current SGL 2 inhibitors.  Fingerstick blood sugar reads 309.  I will discharge patient home with a diagnosis of dysuria and vaginal itching pending the results of her vaginal cytology swab.  I will prescribe Pyridium that she can take every 8 hours to help with the burning with urination.  I will also have her follow-up with her primary care provider regarding her elevated blood sugar.  The glycosuria could be contributing to the patient's symptoms.   Final Clinical Impressions(s) / UC Diagnoses   Final diagnoses:  Vaginal itching  Dysuria  Glycosuria     Discharge Instructions      Your urinalysis did not show any evidence of infection though it does have a significant amount of sugar in your urine.  This can be contributing to the burning with urination.  Your fingerstick also shows that your sugar is high.  Make sure that you are taking your metformin and Lantus as prescribed for control of your blood sugar and make sure that you are following your prescribed diet to help keep your blood sugar under control.  The vaginal swab will check for the presence of bacterial vaginosis or vaginal yeast infection which could be causing your symptoms.  We will hold on treatment until we have the swab results so we know what we are treating.  I will prescribe Pyridium that you can use every 8 hours to help you with the burning with urination.  This medication will turn your urine a very vivid red-orange.  If you develop any lower abdominal pain, back pain, or fevers please  return for reevaluation or follow-up with your primary care provider.     ED Prescriptions     Medication Sig Dispense Auth. Provider   phenazopyridine (PYRIDIUM) 200 MG tablet Take 1 tablet (200 mg total) by mouth 3 (three) times daily. 6 tablet Becky Augusta, NP      PDMP not reviewed this encounter.   Becky Augusta, NP 06/09/23 [phone]

## 2023-06-09 NOTE — ED Triage Notes (Signed)
 Patient presents with vaginal itching and burning. Itching in anal area. Burning while urinating. Sx started last night. No treatment used. Caretaker is presents with her.

## 2023-06-09 NOTE — Discharge Instructions (Addendum)
 Your urinalysis did not show any evidence of infection though it does have a significant amount of sugar in your urine.  This can be contributing to the burning with urination.  Your fingerstick also shows that your sugar is high.  Make sure that you are taking your metformin and Lantus as prescribed for control of your blood sugar and make sure that you are following your prescribed diet to help keep your blood sugar under control.  The vaginal swab will check for the presence of bacterial vaginosis or vaginal yeast infection which could be causing your symptoms.  We will hold on treatment until we have the swab results so we know what we are treating.  I will prescribe Pyridium that you can use every 8 hours to help you with the burning with urination.  This medication will turn your urine a very vivid red-orange.  If you develop any lower abdominal pain, back pain, or fevers please return for reevaluation or follow-up with your primary care provider.

## 2023-06-10 ENCOUNTER — Ambulatory Visit
Admission: RE | Admit: 2023-06-10 | Discharge: 2023-06-10 | Disposition: A | Discharge status: Home / Self Care | Source: Ambulatory Visit | Attending: Physician Assistant | Admitting: Physician Assistant

## 2023-06-10 ENCOUNTER — Ambulatory Visit (INDEPENDENT_AMBULATORY_CARE_PROVIDER_SITE_OTHER): Payer: Medicare Other | Admitting: Physician Assistant

## 2023-06-10 ENCOUNTER — Ambulatory Visit
Admission: RE | Admit: 2023-06-10 | Discharge: 2023-06-10 | Disposition: A | Discharge status: Home / Self Care | Attending: Physician Assistant | Admitting: Physician Assistant

## 2023-06-10 VITALS — BP 128/82 | Ht 67.0 in | Wt 183.0 lb

## 2023-06-10 DIAGNOSIS — M5021 Other cervical disc displacement,  high cervical region: Secondary | ICD-10-CM | POA: Diagnosis not present

## 2023-06-10 DIAGNOSIS — M4802 Spinal stenosis, cervical region: Secondary | ICD-10-CM

## 2023-06-10 DIAGNOSIS — M542 Cervicalgia: Secondary | ICD-10-CM

## 2023-06-10 DIAGNOSIS — M47812 Spondylosis without myelopathy or radiculopathy, cervical region: Secondary | ICD-10-CM | POA: Diagnosis not present

## 2023-06-10 LAB — CERVICOVAGINAL ANCILLARY ONLY
Bacterial Vaginitis (gardnerella): NEGATIVE
Candida Glabrata: NEGATIVE
Candida Vaginitis: POSITIVE — AB
Chlamydia: NEGATIVE
Comment: NEGATIVE
Comment: NEGATIVE
Comment: NEGATIVE
Comment: NEGATIVE
Comment: NEGATIVE
Comment: NORMAL
Neisseria Gonorrhea: NEGATIVE
Trichomonas: NEGATIVE

## 2023-06-11 ENCOUNTER — Telehealth (HOSPITAL_COMMUNITY): Payer: Self-pay

## 2023-06-11 MED ORDER — FLUCONAZOLE 150 MG PO TABS: 150.0000 mg | ORAL_TABLET | Freq: Once | ORAL | 0 refills | Status: AC

## 2023-06-11 NOTE — Telephone Encounter (Signed)
 Per protocol, pt requires tx with Diflucan.  Reviewed with pt's caregiver. Verified pharmacy, prescription sent

## 2023-06-13 NOTE — Progress Notes (Signed)
 Pt with hypotension therefore COVID swab was ordered evaluate for potential causes.

## 2023-06-20 ENCOUNTER — Ambulatory Visit: Payer: Medicare Other | Admitting: Podiatry

## 2023-06-21 ENCOUNTER — Encounter: Payer: Self-pay | Admitting: Podiatry

## 2023-06-21 ENCOUNTER — Ambulatory Visit: Payer: Medicare Other | Admitting: Podiatry

## 2023-06-21 VITALS — Ht 67.0 in | Wt 183.0 lb

## 2023-06-21 DIAGNOSIS — L84 Corns and callosities: Secondary | ICD-10-CM

## 2023-06-21 DIAGNOSIS — E1142 Type 2 diabetes mellitus with diabetic polyneuropathy: Secondary | ICD-10-CM | POA: Diagnosis not present

## 2023-06-21 DIAGNOSIS — M205X1 Other deformities of toe(s) (acquired), right foot: Secondary | ICD-10-CM

## 2023-06-21 DIAGNOSIS — M205X2 Other deformities of toe(s) (acquired), left foot: Secondary | ICD-10-CM | POA: Diagnosis not present

## 2023-06-21 DIAGNOSIS — B351 Tinea unguium: Secondary | ICD-10-CM

## 2023-06-21 NOTE — Progress Notes (Signed)
  Subjective:  Patient ID: Rachel [name], female    DOB: October 08, 1960,  MRN: [id number]  63 y.o. female presents for at risk foot care with history of diabetic neuropathy and callus(es) of both feet and painful thick toenails that are difficult to trim. Painful toenails interfere with ambulation. Aggravating factors include wearing enclosed shoe gear. Pain is relieved with periodic professional debridement. Painful calluses are aggravated when weightbearing with and without shoegear. Pain is relieved with periodic professional debridement.  Patient is requesting new diabetic shoes on today's visit.  PCP is Gracelyn Nurse, MD , and last visit was February 20, 2023.  Allergies  Allergen Reactions   Ace Inhibitors Swelling   Review of Systems: Negative except as noted in the HPI.   Objective:  Rachel [name] is a pleasant 63 y.o. female in NAD. AAO x 3.  Vascular Examination: Vascular status intact b/l with palpable pedal pulses. CFT immediate b/l. Pedal hair present. No edema. No pain with calf compression b/l. Skin temperature gradient WNL b/l. No varicosities noted. No cyanosis or clubbing noted.  Neurological Examination: Pt has subjective symptoms of neuropathy. Protective sensation decreased with 10 gram monofilament b/l.  Dermatological Examination: Pedal skin with normal turgor, texture and tone b/l. No open wounds nor interdigital macerations noted. Toenails 2nd and 3rd toes b/l thick, discolored, elongated with subungual debris and pain on dorsal palpation. Anonychia noted bilateral great toes, bilateral 4th toes, and bilateral 5th toes. Nailbed(s) epithelialized.   Hyperkeratotic lesion(s) submet head 1 b/l.  No erythema, no edema, no drainage, no fluctuance.  Musculoskeletal Examination: Muscle strength 5/5 to b/l LE.  No pain, crepitus noted b/l. No gross pedal deformities. Patient ambulates independently without assistive aids.   Radiographs: None  Assessment:   1.  Pain due to onychomycosis of toenails of both feet   2. Callus   3. Acquired hallux limitus of both feet   4. Diabetic peripheral neuropathy associated with type 2 diabetes mellitus (HCC)    Plan:  -Patient to schedule appointment with Pedorthist for diabetic shoe measurements. -Discussed diabetic shoe benefit available based on patient's diagnoses. Patient/POA would like to proceed. Order entered for one pair extra depth shoes and 3 pair total contact insoles. Patient qualifies based on diagnoses. -Mycotic toenails 1-5 bilaterally were debrided in length and girth with sterile nail nippers and dremel without incident. -Callus(es) posterior aspect of right heel and submet head 1 b/l pared utilizing sterile scalpel blade without complication or incident. Total number debrided =3. -Patient/POA to call should there be question/concern in the interim.  Return in about 3 months (around 09/21/2023).  Freddie Breech, DPM      [facility] LOCATION: [address] [place] [address]                   Office [phone]   [facility] LOCATION: [address] [place], [place] [address] Office [phone]

## 2023-07-18 ENCOUNTER — Ambulatory Visit
Admission: RE | Admit: 2023-07-18 | Discharge: 2023-07-18 | Disposition: A | Discharge status: Home / Self Care | Source: Ambulatory Visit | Attending: Internal Medicine | Admitting: Internal Medicine

## 2023-07-18 DIAGNOSIS — Z1231 Encounter for screening mammogram for malignant neoplasm of breast: Secondary | ICD-10-CM | POA: Diagnosis present

## 2023-07-29 ENCOUNTER — Ambulatory Visit: Admitting: Physical Therapy

## 2023-08-08 ENCOUNTER — Encounter: Payer: Self-pay | Admitting: Physical Therapy

## 2023-08-08 ENCOUNTER — Ambulatory Visit: Attending: Physician Assistant | Admitting: Physical Therapy

## 2023-08-08 DIAGNOSIS — M4802 Spinal stenosis, cervical region: Secondary | ICD-10-CM | POA: Diagnosis not present

## 2023-08-08 DIAGNOSIS — R293 Abnormal posture: Secondary | ICD-10-CM | POA: Diagnosis present

## 2023-08-08 DIAGNOSIS — M542 Cervicalgia: Secondary | ICD-10-CM | POA: Diagnosis present

## 2023-08-08 DIAGNOSIS — M6281 Muscle weakness (generalized): Secondary | ICD-10-CM | POA: Insufficient documentation

## 2023-08-08 NOTE — Therapy (Signed)
 OUTPATIENT PHYSICAL THERAPY NECK EVALUATION   Patient Name: Jonylah Kirchner MRN: [id number] DOB:Jul 02, 1960, 63 y.o., female Today's Date: 08/08/2023  END OF SESSION:  PT End of Session - 08/13/23 1353     Visit Number 1    Number of Visits 13    Date for PT Re-Evaluation 09/19/23    PT Start Time 1118    PT Stop Time 1200    PT Time Calculation (min) 42 min    Activity Tolerance Patient tolerated treatment well             Past Medical History:  Diagnosis Date   Cerebral palsy (HCC)    Diabetes mellitus without complication (HCC)    Hypercholesteremia    Hypertension    Past Surgical History:  Procedure Laterality Date   DILATION AND CURETTAGE OF UTERUS     Patient Active Problem List   Diagnosis Date Noted   Porokeratosis 05/17/2022   Lower extremity edema 08/16/2020   Pain due to onychomycosis of toenails of both feet 02/02/2019   Acute gastroenteritis 01/26/2018   Diabetic polyneuropathy associated with type 2 diabetes mellitus (HCC) 09/12/2015   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   Aggression 07/13/2015   Palpitations 07/06/2015   Essential hypertension, benign 07/06/2015   Obesity 07/06/2015   Cerebral palsy (HCC) 12/24/2013   Controlled type 2 diabetes mellitus without complication (HCC) 12/24/2013   HLD (hyperlipidemia) 12/24/2013   BP (high blood pressure) 12/24/2013   Adiposity 12/24/2013   Peripheral nerve disease 12/24/2013    PCP: Little Riff, MD  REFERRING PROVIDER: Ludwig Safer, PA-C  REFERRING DIAG:  [phone] (ICD-10-CM) - Spinal stenosis in cervical region    RATIONALE FOR EVALUATION AND TREATMENT: Rehabilitation  THERAPY DIAG: Cervicalgia  Abnormal posture  Muscle weakness (generalized)  ONSET DATE: 6 months ago   FOLLOW-UP APPT SCHEDULED WITH REFERRING PROVIDER: Yes    SUBJECTIVE:                                                                                                                                                                                          Chief Complaint: Pt is a 63 year old female with Hx of cerebral palsy and moderate intellectual disability with referral for neck pain/cervical spine stenosis.   Pertinent History Ms. Kailee Garno is here today with a history of cerebral palsy, moderate intellectual disability, diabetes, high cholesterol, and high blood pressure.   Pt reports pain along posterior cervical spine and pain along L upper trap region. Pt had pain about 6 months ago. Patient reports some episodes of dizziness where it is hard to see. Pt reports episode of lightheadedness with transferring weeks  ago. She thinks she may have gotten spinning sensation some weeks ago.   Pain:  Pain Intensity: Moderate pain per patient (difficulty assessing NPRS with intellectual disability) Pain location: Neck, L upper trap  Pain Quality: burning, "dizzy headed", stiff  Radiating: Yes ; LUE  Numbness/Tingling: Yes; NT affecting LUE/L hand Focal Weakness: Yes; some difficulty holding/gripping with L hand  Aggravating factors: lifting herself up out of bed Relieving factors: lying down 24-hour pain behavior: worse in AM  History of prior neck injury, pain, surgery, or therapy: Yes;  Dominant hand: right Imaging: Yes   C-spine CT without contrast (05/18/23)  CLINICAL DATA:  Neck trauma, impaired ROM (Age 5-64y)   EXAM: CT CERVICAL SPINE WITHOUT CONTRAST   TECHNIQUE: Multidetector CT imaging of the cervical spine was performed without intravenous contrast. Multiplanar CT image reconstructions were also generated.   RADIATION DOSE REDUCTION: This exam was performed according to the departmental dose-optimization program which includes automated exposure control, adjustment of the mA and/or kV according to patient size and/or use of iterative reconstruction technique.   COMPARISON:  04/18/2019   FINDINGS: Alignment: Facet joints are aligned without dislocation or  traumatic listhesis. Dens and lateral masses are aligned.   Skull base and vertebrae: No acute fracture. No primary bone lesion or focal pathologic process.   Soft tissues and spinal canal: No prevertebral fluid or swelling. No visible canal hematoma.   Disc levels: Mild multilevel cervical spondylosis. Disc protrusion most pronounced at C3-C4.   Upper chest: Included lung apices are clear.   Other: Bilateral carotid atherosclerosis.   IMPRESSION: No acute fracture or traumatic listhesis of the cervical spine.     Electronically Signed   By: Leverne Reading D.O.   On: 05/18/2023 15:52  Red flags (personal history of cancer, h/o spinal tumors, history of compression fracture, chills/fever, night sweats, nausea, vomiting, unrelenting pain): Negative  PRECAUTIONS: None  WEIGHT BEARING RESTRICTIONS: No  FALLS: Has patient fallen in last 6 months? No  -1 fall in last year (during 2024)  Living Environment Lives with: Pt has own bedroom in group home Lives in: Group home Stairs: One small step up to get into group home  Has following equipment at home: Grab bars installed in bathroom/shower  Prior level of function: Independent with household mobility without device  Occupational demands: -  Hobbies: Crafts with [employer]/activities with group home  Patient Goals: Get neck feeling better    OBJECTIVE:   Cognition Patient is oriented to person, place, and time.  Recent memory is intact.  Remote memory is difficult to obtain with some communication deficits.  Attention span and concentration are intact.  Expressive speech is intact.  Patient's fund of knowledge is limited due to lifelong CP/intellectual disability    Gross Musculoskeletal Assessment Tremor: None Bulk: Normal Tone: Normal   Posture Cervical protraction, forward head; rounded shoulders; increased thoracic kyphosis  AROM  Shoulder AROM Flexion 110 bilat Abduction 120 bilat     AROM (Normal range in degrees) AROM 08/08/23  Cervical  Flexion (50) WNL  Extension (80) 18  Right lateral flexion (45) 20  Left lateral flexion (45) 21  Right rotation (85) 50  Left rotation (85) 53  (* = pain; Blank rows = not tested)   MMT MMT (out of 5) Right Left      Shoulder   Flexion 4- 4-  Extension    Abduction 4 4  Internal rotation 5 5  External rotation 4- 4-  Horizontal abduction  Horizontal adduction    Lower Trapezius    Rhomboids        Elbow  Flexion 5 5  Extension 4 4  Pronation    Supination        (* = pain; Blank rows = not tested)  Sensation Grossly intact to light touch bilateral UE as determined by testing dermatomes C2-T2. Proprioception and hot/cold testing deferred on this date.   Reflexes Deferred   Palpation Location LEFT  RIGHT           Suboccipitals    Cervical paraspinals 1 0  Upper Trapezius 2 0  Levator Scapulae 1 0  Rhomboid Major/Minor 2 1  (Blank rows = not tested) Graded on 0-4 scale (0 = no pain, 1 = pain, 2 = pain with wincing/grimacing/flinching, 3 = pain with withdrawal, 4 = unwilling to allow palpation), (Blank rows = not tested)  Repeated Movements Deferred   Passive Accessory Intervertebral Motion Deferred, will be completed on visit # 2.     SPECIAL TESTS Spurlings A (ipsilateral lateral flexion/axial compression): Next visit Distraction Test: Next visit Hoffman Sign (cervical cord compression): R: Negative L: Negative   TODAY'S TREATMENT   Therapeutic Exercise - for HEP establishment, discussion on appropriate exercise/activity modification, PT education   Reviewed baseline home exercises and provided handout for [facility] program (see Access Code); tactile cueing and therapist demonstration utilized as needed for carryover of proper technique to HEP.    Patient education on current condition, anatomy involved, prognosis, plan of care. Discussion on activity modification to prevent flare-up  of condition, including postural correction and avoidance of prolonged cervical protraction/flexion during ADLs/seated activity.     PATIENT EDUCATION:  Education details: see above for patient education details Person educated: Patient and Caregiver Trenette Education method: Explanation, Demonstration, and Handouts Education comprehension: verbalized understanding, returned demonstration, verbal cues required, tactile cues required, and needs further education   HOME EXERCISE PROGRAM:  Access Code: [email or web address] URL: [email or web address] Date: 08/08/2023 Prepared by: Denese Finn  Exercises - Seated Cervical Retraction  - 2 x daily - 7 x weekly - 2 sets - 10 reps - Seated Upper Trapezius Stretch  - 2 x daily - 7 x weekly - 3 sets - 30sec hold - Seated Scapular Retraction  - 2 x daily - 7 x weekly - 2 sets - 10 reps - 3sec hold  ASSESSMENT:  CLINICAL IMPRESSION: Patient is a pleasant 63 y.o. female with history of cerebral palsy and moderate intellectual disability who is ambulatory without AD and completes all dressing/hygiene activities in group home without assistance; pt was seen today for physical therapy evaluation and treatment for cervicalgia. Pt has some hx of L upper limb referred symptoms and N/T affecting L hand. Some objective measures were not completed at initial eval due to longer time required with subjective and tests/measures due to cognitive/communication impairments. We will need to further work on specific cervical radiculopathy testing and passive accessories next visit. Her program was initiated with focus on postural correction/activity modification and gentle stretching for L paracervical musculature. Pt will continue to benefit from skilled PT services to address deficits and improve function.  OBJECTIVE IMPAIRMENTS: decreased ROM, decreased strength, hypomobility, impaired flexibility, impaired UE functional use, postural dysfunction, and pain.    ACTIVITY LIMITATIONS: sleeping, transfers, bed mobility, and reach over head  PARTICIPATION LIMITATIONS: cleaning, community activity, and participation in occupational group home activities  PERSONAL FACTORS: Age, Time since onset of injury/illness/exacerbation, and 3+ comorbidities: (cerebral palsy, HTN, peripheral nerve  disease, HLD, Type 2 DM, moderate intellectual disability) are also affecting patient's functional outcome.   REHAB POTENTIAL: Fair given comorbidities and CP/intellectual disability  CLINICAL DECISION MAKING: Evolving/moderate complexity  EVALUATION COMPLEXITY: Moderate   GOALS: Goals reviewed with patient? Yes  SHORT TERM GOALS: Target date: 09/05/2023  Pt will be independent with HEP to improve strength and decrease neck pain to improve pain-free function at home and work. Baseline: 08/08/23: Baseline HEP initiated and reviewed; [facility] handout provided. Goal status: INITIAL   LONG TERM GOALS: Target date: 09/26/2023  Patient will have cervical spine rotation to 60 deg in either direction and extension to 30 deg or greater without reproduction of pain indicative of sufficient ROM required for ADLs and self-care activities Baseline: 08/08/23: Mild limitation with R/L rotation and extension 18 deg Goal status: INITIAL  2.  Pt will independently demonstrate plumb line posture with external acoustic meatus no more than 2 cm anterior to ACJ during sitting/standing periscapular exercises indicative of improved postural awareness and less likelihood of recurrent neck pain episodes. Baseline: 08/08/23: Pt rests in significant C-spine protraction and kyphotic posture at rest, postural changes are largely flexible.  Goal status: INITIAL  3.  Pt will perform bed transfers/bed mobility (rolling, supine to sit/sitting to supine) without increase in neck pain as needed for independent self-care and improved sleep quality Baseline: 08/08/23: Notable pain with getting up from her  bed in L paracervical/upper quarter region.  Goal status: INITIAL    PLAN: PT FREQUENCY: 1-2x/week  PT DURATION: 6 weeks  PLANNED INTERVENTIONS: Therapeutic exercises, Therapeutic activity, Neuromuscular re-education, Balance training, Gait training, Patient/Family education, Self Care, Joint mobilization, Joint manipulation, Vestibular training, Canalith repositioning, Orthotic/Fit training, DME instructions, Dry Needling, Electrical stimulation, Spinal manipulation, Spinal mobilization, Cryotherapy, Moist heat, Taping, Traction, Ultrasound, Ionotophoresis 4mg /ml Dexamethasone, Manual therapy, and Re-evaluation.  PLAN FOR NEXT SESSION: Continue with manual therapy and mobilization techniques to improve C-spine ROM and L paracervical sensitivity. Progress with scap retraction/depression drills and postural re-education as tolerated.    Denese Finn, PT, DPT #[id number]  Aleatha Hunting, PT 08/13/2023, 1:53 PM

## 2023-08-13 ENCOUNTER — Ambulatory Visit: Admitting: Physical Therapy

## 2023-08-13 ENCOUNTER — Telehealth: Payer: Self-pay | Admitting: Podiatry

## 2023-08-13 NOTE — Telephone Encounter (Signed)
 Called to cancel DSM appointment; Pt would like referral to [facility]

## 2023-08-15 ENCOUNTER — Ambulatory Visit: Admitting: Physical Therapy

## 2023-08-15 VITALS — BP 130/49 | HR 56

## 2023-08-15 DIAGNOSIS — M542 Cervicalgia: Secondary | ICD-10-CM

## 2023-08-15 DIAGNOSIS — R293 Abnormal posture: Secondary | ICD-10-CM

## 2023-08-15 DIAGNOSIS — M6281 Muscle weakness (generalized): Secondary | ICD-10-CM

## 2023-08-15 NOTE — Therapy (Unsigned)
 OUTPATIENT PHYSICAL THERAPY NECK TREATMENT   Patient Name: Rachel [name] MRN: [id number] DOB:June 28, 1960, 63 y.o., female Today's Date: 08/15/2023  END OF SESSION:  PT End of Session - 08/15/23 1009     Visit Number 2    Number of Visits 13    Date for PT Re-Evaluation 09/19/23    PT Start Time 0954    PT Stop Time 1041    PT Time Calculation (min) 47 min    Activity Tolerance Patient tolerated treatment well              Past Medical History:  Diagnosis Date   Cerebral palsy (HCC)    Diabetes mellitus without complication (HCC)    Hypercholesteremia    Hypertension    Past Surgical History:  Procedure Laterality Date   DILATION AND CURETTAGE OF UTERUS     Patient Active Problem List   Diagnosis Date Noted   Porokeratosis 05/17/2022   Lower extremity edema 08/16/2020   Pain due to onychomycosis of toenails of both feet 02/02/2019   Acute gastroenteritis 01/26/2018   Diabetic polyneuropathy associated with type 2 diabetes mellitus (HCC) 09/12/2015   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   Aggression 07/13/2015   Palpitations 07/06/2015   Essential hypertension, benign 07/06/2015   Obesity 07/06/2015   Cerebral palsy (HCC) 12/24/2013   Controlled type 2 diabetes mellitus without complication (HCC) 12/24/2013   HLD (hyperlipidemia) 12/24/2013   BP (high blood pressure) 12/24/2013   Adiposity 12/24/2013   Peripheral nerve disease 12/24/2013    PCP: Little Riff, MD  REFERRING PROVIDER: Ludwig Safer, PA-C  REFERRING DIAG:  [phone] (ICD-10-CM) - Spinal stenosis in cervical region    RATIONALE FOR EVALUATION AND TREATMENT: Rehabilitation  THERAPY DIAG: Cervicalgia  Abnormal posture  Muscle weakness (generalized)  ONSET DATE: 6 months ago   FOLLOW-UP APPT SCHEDULED WITH REFERRING PROVIDER: Yes    SUBJECTIVE:                                                                                                                                                                                          Chief Complaint: Pt is a 63 year old female with Hx of cerebral palsy and moderate intellectual disability with referral for neck pain/cervical spine stenosis.   Pertinent History Rachel [name] is here today with a history of cerebral palsy, moderate intellectual disability, diabetes, high cholesterol, and high blood pressure.   Pt reports pain along posterior cervical spine and pain along L upper trap region. Pt had pain about 6 months ago. Patient reports some episodes of dizziness where it is hard to see. Pt reports episode of lightheadedness with transferring  weeks ago. She thinks she may have gotten spinning sensation some weeks ago.   Pain:  Pain Intensity: Moderate pain per patient (difficulty assessing NPRS with intellectual disability) Pain location: Neck, L upper trap  Pain Quality: burning, "dizzy headed", stiff  Radiating: Yes ; LUE  Numbness/Tingling: Yes; NT affecting LUE/L hand Focal Weakness: Yes; some difficulty holding/gripping with L hand  Aggravating factors: lifting herself up out of bed Relieving factors: lying down 24-hour pain behavior: worse in AM  History of prior neck injury, pain, surgery, or therapy: Yes;  Dominant hand: right Imaging: Yes   C-spine CT without contrast (05/18/23)  CLINICAL DATA:  Neck trauma, impaired ROM (Age 85-64y)   EXAM: CT CERVICAL SPINE WITHOUT CONTRAST   TECHNIQUE: Multidetector CT imaging of the cervical spine was performed without intravenous contrast. Multiplanar CT image reconstructions were also generated.   RADIATION DOSE REDUCTION: This exam was performed according to the departmental dose-optimization program which includes automated exposure control, adjustment of the mA and/or kV according to patient size and/or use of iterative reconstruction technique.   COMPARISON:  04/18/2019   FINDINGS: Alignment: Facet joints are aligned without dislocation or  traumatic listhesis. Dens and lateral masses are aligned.   Skull base and vertebrae: No acute fracture. No primary bone lesion or focal pathologic process.   Soft tissues and spinal canal: No prevertebral fluid or swelling. No visible canal hematoma.   Disc levels: Mild multilevel cervical spondylosis. Disc protrusion most pronounced at C3-C4.   Upper chest: Included lung apices are clear.   Other: Bilateral carotid atherosclerosis.   IMPRESSION: No acute fracture or traumatic listhesis of the cervical spine.     Electronically Signed   By: Leverne Reading D.O.   On: 05/18/2023 15:52  Red flags (personal history of cancer, h/o spinal tumors, history of compression fracture, chills/fever, night sweats, nausea, vomiting, unrelenting pain): Negative  PRECAUTIONS: None  WEIGHT BEARING RESTRICTIONS: No  FALLS: Has patient fallen in last 6 months? No  -1 fall in last year (during 2024)  Living Environment Lives with: Pt has own bedroom in group home Lives in: Group home Stairs: One small step up to get into group home  Has following equipment at home: Grab bars installed in bathroom/shower  Prior level of function: Independent with household mobility without device  Occupational demands: -  Hobbies: Crafts with [employer]/activities with group home  Patient Goals: Get neck feeling better    OBJECTIVE:   Cognition Patient is oriented to person, place, and time.  Recent memory is intact.  Remote memory is difficult to obtain with some communication deficits.  Attention span and concentration are intact.  Expressive speech is intact.  Patient's fund of knowledge is limited due to lifelong CP/intellectual disability    Gross Musculoskeletal Assessment Tremor: None Bulk: Normal Tone: Normal   Posture Cervical protraction, forward head; rounded shoulders; increased thoracic kyphosis  AROM  Shoulder AROM Flexion 110 bilat Abduction 120 bilat     AROM (Normal range in degrees) AROM 08/08/23  Cervical  Flexion (50) WNL  Extension (80) 18  Right lateral flexion (45) 20  Left lateral flexion (45) 21  Right rotation (85) 50  Left rotation (85) 53  (* = pain; Blank rows = not tested)   MMT MMT (out of 5) Right Left      Shoulder   Flexion 4- 4-  Extension    Abduction 4 4  Internal rotation 5 5  External rotation 4- 4-  Horizontal abduction  Horizontal adduction    Lower Trapezius    Rhomboids        Elbow  Flexion 5 5  Extension 4 4  Pronation    Supination        (* = pain; Blank rows = not tested)  Sensation Grossly intact to light touch bilateral UE as determined by testing dermatomes C2-T2. Proprioception and hot/cold testing deferred on this date.   Reflexes Deferred   Palpation Location LEFT  RIGHT           Suboccipitals    Cervical paraspinals 1 0  Upper Trapezius 2 0  Levator Scapulae 1 0  Rhomboid Major/Minor 2 1  (Blank rows = not tested) Graded on 0-4 scale (0 = no pain, 1 = pain, 2 = pain with wincing/grimacing/flinching, 3 = pain with withdrawal, 4 = unwilling to allow palpation), (Blank rows = not tested)  Repeated Movements Deferred   Passive Accessory Intervertebral Motion Updated 08/15/23: Dec C5-7 CPA, hypomobile R to L CPA at C3-6 levels.      SPECIAL TESTS Updated 08/15/23:  -Spurlings A (ipsilateral lateral flexion/axial compression): Negative bilat -Distraction Test: Positive for pain relief Hoffman Sign (cervical cord compression): R: Negative L: Negative   TODAY'S TREATMENT    SUBJECTIVE STATEMENT:   Patient reports minimal symptoms at arrival. She reports feeling "funny" and dizzy with getting up from bed; upon further questioning. She reports some dizziness and her head feeling "funny" with lying down.    *Completion of objective testing to supplement initial exam (see above) -Special tests  -Passive accessories  Today's Vitals   08/15/23 1003 08/15/23 1004   BP: (!) 157/60 (!) 130/49  Pulse: (!) 56     Orthostatic hypotension testing (see BP values above)  157/60 in sitting  130/49 in standing   Manual Therapy - for symptom modulation, soft tissue sensitivity and mobility, joint mobility, ROM   STM/DTM L upper trapezius and L cervical paraspinals C3-7; x 10 minutes -Passive accessory testing R to L sideglide at C3-6, gr III for mobility; 2 x 30 sec bouts   Neuromuscular Re-education - for postural re-education, plumb line posture positional awareness/proprioception  Tband standing row with upright posture; 1x10 with Red Tband, 1x10 with Green Tband  Cervical retraction; 1 x 10 for HEP review Upper trap stretch; 2 x 30 sec bilat  -for UT inhibition   PATIENT/CAREGIVER EDUCATION: Discussed contribution of orthostatic hypotension to patient's dizziness/lightheadedness. Discussed potential referral or screen with vestibular provider if patient has ongoing issues with dizziness or dysequilibrium with lying down. HEP reviewed with pt and caregivers.     PATIENT EDUCATION:  Education details: see above for patient education details Person educated: Patient and Caregiver [place] Education method: Explanation, Demonstration, and Handouts Education comprehension: verbalized understanding, returned demonstration, verbal cues required, tactile cues required, and needs further education   HOME EXERCISE PROGRAM:  Access Code: [email or web address] URL: [email or web address] Date: 08/08/2023 Prepared by: Denese Finn  Exercises - Seated Cervical Retraction  - 2 x daily - 7 x weekly - 2 sets - 10 reps - Seated Upper Trapezius Stretch  - 2 x daily - 7 x weekly - 3 sets - 30sec hold - Seated Scapular Retraction  - 2 x daily - 7 x weekly - 2 sets - 10 reps - 3sec hold  ASSESSMENT:  CLINICAL IMPRESSION: Patient is very pleasant and participates well with PT. She has increased urinary frequency today associated with use of Lasix to  improve LE edema recently experienced. No  dysuria or other concerning symptoms reported. Patient does have dizziness with change in position; this could be exacerbated today with loss of fluid volume of diuretic medication. Testing today confirms orthostatic hypotension; pt and caregiver were informed of safe technique with transferring and change in position in setting of orthostatic hypotension. Pt exhibits functional ROM with passive C-spine rotation in lying today. She tolerates exercises well without notable c/o pain. Pt will continue to benefit from skilled PT services to address deficits and improve function.  OBJECTIVE IMPAIRMENTS: decreased ROM, decreased strength, hypomobility, impaired flexibility, impaired UE functional use, postural dysfunction, and pain.   ACTIVITY LIMITATIONS: sleeping, transfers, bed mobility, and reach over head  PARTICIPATION LIMITATIONS: cleaning, community activity, and participation in occupational group home activities  PERSONAL FACTORS: Age, Time since onset of injury/illness/exacerbation, and 3+ comorbidities: (cerebral palsy, HTN, peripheral nerve disease, HLD, Type 2 DM, moderate intellectual disability) are also affecting patient's functional outcome.   REHAB POTENTIAL: Fair given comorbidities and CP/intellectual disability  CLINICAL DECISION MAKING: Evolving/moderate complexity  EVALUATION COMPLEXITY: Moderate   GOALS: Goals reviewed with patient? Yes  SHORT TERM GOALS: Target date: 09/05/2023  Pt will be independent with HEP to improve strength and decrease neck pain to improve pain-free function at home and work. Baseline: 08/08/23: Baseline HEP initiated and reviewed; [employer] handout provided. Goal status: INITIAL   LONG TERM GOALS: Target date: 09/26/2023  Patient will have cervical spine rotation to 60 deg in either direction and extension to 30 deg or greater without reproduction of pain indicative of sufficient ROM required for ADLs and  self-care activities Baseline: 08/08/23: Mild limitation with R/L rotation and extension 18 deg Goal status: INITIAL  2.  Pt will independently demonstrate plumb line posture with external acoustic meatus no more than 2 cm anterior to ACJ during sitting/standing periscapular exercises indicative of improved postural awareness and less likelihood of recurrent neck pain episodes. Baseline: 08/08/23: Pt rests in significant C-spine protraction and kyphotic posture at rest, postural changes are largely flexible.  Goal status: INITIAL  3.  Pt will perform bed transfers/bed mobility (rolling, supine to sit/sitting to supine) without increase in neck pain as needed for independent self-care and improved sleep quality Baseline: 08/08/23: Notable pain with getting up from her bed in L paracervical/upper quarter region.  Goal status: INITIAL    PLAN: PT FREQUENCY: 1-2x/week  PT DURATION: 6 weeks  PLANNED INTERVENTIONS: Therapeutic exercises, Therapeutic activity, Neuromuscular re-education, Balance training, Gait training, Patient/Family education, Self Care, Joint mobilization, Joint manipulation, Vestibular training, Canalith repositioning, Orthotic/Fit training, DME instructions, Dry Needling, Electrical stimulation, Spinal manipulation, Spinal mobilization, Cryotherapy, Moist heat, Taping, Traction, Ultrasound, Ionotophoresis 4mg /ml Dexamethasone, Manual therapy, and Re-evaluation.  PLAN FOR NEXT SESSION: Continue with manual therapy and mobilization techniques to improve C-spine ROM and L paracervical sensitivity. Progress with scap retraction/depression drills and postural re-education as tolerated.    Denese Finn, PT, DPT #[id number]  Aleatha Hunting, PT 08/15/2023, 11:09 AM

## 2023-08-16 ENCOUNTER — Other Ambulatory Visit (INDEPENDENT_AMBULATORY_CARE_PROVIDER_SITE_OTHER): Admitting: Podiatry

## 2023-08-16 ENCOUNTER — Encounter: Payer: Self-pay | Admitting: Physical Therapy

## 2023-08-16 DIAGNOSIS — M205X2 Other deformities of toe(s) (acquired), left foot: Secondary | ICD-10-CM

## 2023-08-16 DIAGNOSIS — E1142 Type 2 diabetes mellitus with diabetic polyneuropathy: Secondary | ICD-10-CM

## 2023-08-16 DIAGNOSIS — M205X1 Other deformities of toe(s) (acquired), right foot: Secondary | ICD-10-CM

## 2023-08-16 DIAGNOSIS — L84 Corns and callosities: Secondary | ICD-10-CM

## 2023-08-16 NOTE — Progress Notes (Signed)
 1. Diabetic peripheral neuropathy associated with type 2 diabetes mellitus (HCC)   2. Callus   3. Acquired hallux limitus of both feet    Orders Placed This Encounter  Procedures   For Home Use Only DME Diabetic Shoe    To [facility]: Dispense one pair extra depth shoes and 3 pair custom insoles. Offload calluses submet head 1 bilaterally.

## 2023-08-19 NOTE — Therapy (Signed)
 OUTPATIENT PHYSICAL THERAPY NECK TREATMENT   Patient Name: Rachel [name] MRN: [id number] DOB:Aug 21, 1960, 63 y.o., female Today's Date: 08/20/2023  END OF SESSION:  PT End of Session - 08/20/23 0944     Visit Number 3    Number of Visits 13    Date for PT Re-Evaluation 09/19/23    PT Start Time 0945    PT Stop Time 1029    PT Time Calculation (min) 44 min    Activity Tolerance Patient tolerated treatment well               Past Medical History:  Diagnosis Date   Cerebral palsy (HCC)    Diabetes mellitus without complication (HCC)    Hypercholesteremia    Hypertension    Past Surgical History:  Procedure Laterality Date   DILATION AND CURETTAGE OF UTERUS     Patient Active Problem List   Diagnosis Date Noted   Porokeratosis 05/17/2022   Lower extremity edema 08/16/2020   Pain due to onychomycosis of toenails of both feet 02/02/2019   Acute gastroenteritis 01/26/2018   Diabetic polyneuropathy associated with type 2 diabetes mellitus (HCC) 09/12/2015   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   Aggression 07/13/2015   Palpitations 07/06/2015   Essential hypertension, benign 07/06/2015   Obesity 07/06/2015   Cerebral palsy (HCC) 12/24/2013   Controlled type 2 diabetes mellitus without complication (HCC) 12/24/2013   HLD (hyperlipidemia) 12/24/2013   BP (high blood pressure) 12/24/2013   Adiposity 12/24/2013   Peripheral nerve disease 12/24/2013    PCP: Little Riff, MD  REFERRING PROVIDER: Ludwig Safer, PA-C  REFERRING DIAG:  [phone] (ICD-10-CM) - Spinal stenosis in cervical region    RATIONALE FOR EVALUATION AND TREATMENT: Rehabilitation  THERAPY DIAG: Cervicalgia  Abnormal posture  Muscle weakness (generalized)  ONSET DATE: 6 months ago   FOLLOW-UP APPT SCHEDULED WITH REFERRING PROVIDER: Yes   Pertinent History Ms. Rachel [name] is here today with a history of cerebral palsy, moderate intellectual disability, diabetes, high  cholesterol, and high blood pressure.   Pt reports pain along posterior cervical spine and pain along L upper trap region. Pt had pain about 6 months ago. Patient reports some episodes of dizziness where it is hard to see. Pt reports episode of lightheadedness with transferring weeks ago. She thinks she may have gotten spinning sensation some weeks ago.   Pain:  Pain Intensity: Moderate pain per patient (difficulty assessing NPRS with intellectual disability) Pain location: Neck, L upper trap  Pain Quality: burning, "dizzy headed", stiff  Radiating: Yes ; LUE  Numbness/Tingling: Yes; NT affecting LUE/L hand Focal Weakness: Yes; some difficulty holding/gripping with L hand  Aggravating factors: lifting herself up out of bed Relieving factors: lying down 24-hour pain behavior: worse in AM  History of prior neck injury, pain, surgery, or therapy: Yes;  Dominant hand: right Imaging: Yes   C-spine CT without contrast (05/18/23)  CLINICAL DATA:  Neck trauma, impaired ROM (Age 4-64y)   EXAM: CT CERVICAL SPINE WITHOUT CONTRAST   TECHNIQUE: Multidetector CT imaging of the cervical spine was performed without intravenous contrast. Multiplanar CT image reconstructions were also generated.   RADIATION DOSE REDUCTION: This exam was performed according to the departmental dose-optimization program which includes automated exposure control, adjustment of the mA and/or kV according to patient size and/or use of iterative reconstruction technique.   COMPARISON:  04/18/2019   FINDINGS: Alignment: Facet joints are aligned without dislocation or traumatic listhesis. Dens and lateral masses are aligned.   Skull base  and vertebrae: No acute fracture. No primary bone lesion or focal pathologic process.   Soft tissues and spinal canal: No prevertebral fluid or swelling. No visible canal hematoma.   Disc levels: Mild multilevel cervical spondylosis. Disc protrusion most pronounced at C3-C4.    Upper chest: Included lung apices are clear.   Other: Bilateral carotid atherosclerosis.   IMPRESSION: No acute fracture or traumatic listhesis of the cervical spine.     Electronically Signed   By: Leverne Reading D.O.   On: 05/18/2023 15:52  Red flags (personal history of cancer, h/o spinal tumors, history of compression fracture, chills/fever, night sweats, nausea, vomiting, unrelenting pain): Negative  PRECAUTIONS: None  WEIGHT BEARING RESTRICTIONS: No  FALLS: Has patient fallen in last 6 months? No  -1 fall in last year (during 2024)  Living Environment Lives with: Pt has own bedroom in group home Lives in: Group home Stairs: One small step up to get into group home  Has following equipment at home: Grab bars installed in bathroom/shower  Prior level of function: Independent with household mobility without device  Occupational demands: -  Hobbies: Crafts with [employer]/activities with group home  Patient Goals: Get neck feeling better    OBJECTIVE:   Cognition Patient is oriented to person, place, and time.  Recent memory is intact.  Remote memory is difficult to obtain with some communication deficits.  Attention span and concentration are intact.  Expressive speech is intact.  Patient's fund of knowledge is limited due to lifelong CP/intellectual disability  Posture Cervical protraction, forward head; rounded shoulders; increased thoracic kyphosis  AROM  Shoulder AROM Flexion 110 bilat Abduction 120 bilat    AROM (Normal range in degrees) AROM 08/08/23  Cervical  Flexion (50) WNL  Extension (80) 18  Right lateral flexion (45) 20  Left lateral flexion (45) 21  Right rotation (85) 50  Left rotation (85) 53  (* = pain; Blank rows = not tested)   MMT MMT (out of 5) Right Left      Shoulder   Flexion 4- 4-  Extension    Abduction 4 4  Internal rotation 5 5  External rotation 4- 4-  Horizontal abduction    Horizontal  adduction    Lower Trapezius    Rhomboids        Elbow  Flexion 5 5  Extension 4 4  Pronation    Supination        (* = pain; Blank rows = not tested)  Sensation Grossly intact to light touch bilateral UE as determined by testing dermatomes C2-T2. Proprioception and hot/cold testing deferred on this date.   Reflexes Deferred   Palpation Location LEFT  RIGHT           Suboccipitals    Cervical paraspinals 1 0  Upper Trapezius 2 0  Levator Scapulae 1 0  Rhomboid Major/Minor 2 1  (Blank rows = not tested) Graded on 0-4 scale (0 = no pain, 1 = pain, 2 = pain with wincing/grimacing/flinching, 3 = pain with withdrawal, 4 = unwilling to allow palpation), (Blank rows = not tested)  Repeated Movements Deferred   Passive Accessory Intervertebral Motion Updated 08/15/23: Dec C5-7 CPA, hypomobile R to L CPA at C3-6 levels.      SPECIAL TESTS Updated 08/15/23:  -Spurlings A (ipsilateral lateral flexion/axial compression): Negative bilat -Distraction Test: Positive for pain relief Hoffman Sign (cervical cord compression): R: Negative L: Negative     TODAY'S TREATMENT    SUBJECTIVE STATEMENT:  Patient reports her edema has improved with use of Lasix. Pt denies notable pain this AM. Her caregiver reports no recent significant compalints of pain   There were no vitals filed for this visit.   Manual Therapy - for symptom modulation, soft tissue sensitivity and mobility, joint mobility, ROM   STM/DTM L upper trapezius and L>R cervical paraspinals C3-7; x 15 minutes Supine cervical traction, general, intermittent 10-second bouts; x 5 minutes R to L sideglide at C3-6, gr III for mobility; 2 x 30 sec bouts   Neuromuscular Re-education - for postural re-education, plumb line posture positional awareness/proprioception  Seated cervical rotation, alternating R/L; x10 ea dir Tband standing row with upright posture; 2x10 with Boyd Cabal    PATIENT/CAREGIVER EDUCATION:  Discussed contribution of orthostatic hypotension to patient's dizziness/lightheadedness. Discussed potential referral or screen with vestibular provider if patient has ongoing issues with dizziness or dysequilibrium with lying down. HEP reviewed with pt and caregivers.   *not today* Cervical retraction; 1 x 10 for HEP review Upper trap stretch; 2 x 30 sec bilat  -for UT inhibition   PATIENT EDUCATION:  Education details: see above for patient education details Person educated: Patient and Caregiver [place]ette Education method: Explanation, Demonstration, and Handouts Education comprehension: verbalized understanding, returned demonstration, verbal cues required, tactile cues required, and needs further education   HOME EXERCISE PROGRAM:  Access Code: [email or web address] URL: [email or web address] Date: 08/20/2023 Prepared by: Denese Finn  Exercises - Seated Cervical Retraction  - 2 x daily - 7 x weekly - 2 sets - 10 reps - Seated Upper Trapezius Stretch  - 2 x daily - 7 x weekly - 3 sets - 30sec hold - Seated Scapular Retraction  - 2 x daily - 7 x weekly - 2 sets - 10 reps - 3sec hold - Seated Cervical Rotation AROM  - 2 x daily - 7 x weekly - 2 sets - 10 reps   ASSESSMENT:  CLINICAL IMPRESSION: Patient is very pleasant and participates well with PT. She fortunately has minimal pain at baseline today, and she has not complained frequently about neck/upper quarter pain per caregiver. She does have some evidence of orthostatic hypotension which may be associated with pt taking diuretic medication; sensation of spinning with lying down may be due to other cause (CNS, vestibular system). Discussed with pt and caregiver potential for otbaining referral to ENT or completing vestibular screen prn if vertigo with lying persists. She tolerates exercises well without notable c/o pain. Pt will continue to benefit from skilled PT services to address deficits and improve function.  OBJECTIVE  IMPAIRMENTS: decreased ROM, decreased strength, hypomobility, impaired flexibility, impaired UE functional use, postural dysfunction, and pain.   ACTIVITY LIMITATIONS: sleeping, transfers, bed mobility, and reach over head  PARTICIPATION LIMITATIONS: cleaning, community activity, and participation in occupational group home activities  PERSONAL FACTORS: Age, Time since onset of injury/illness/exacerbation, and 3+ comorbidities: (cerebral palsy, HTN, peripheral nerve disease, HLD, Type 2 DM, moderate intellectual disability) are also affecting patient's functional outcome.   REHAB POTENTIAL: Fair given comorbidities and CP/intellectual disability  CLINICAL DECISION MAKING: Evolving/moderate complexity  EVALUATION COMPLEXITY: Moderate   GOALS: Goals reviewed with patient? Yes  SHORT TERM GOALS: Target date: 09/05/2023  Pt will be independent with HEP to improve strength and decrease neck pain to improve pain-free function at home and work. Baseline: 08/08/23: Baseline HEP initiated and reviewed; [employer] handout provided. Goal status: INITIAL   LONG TERM GOALS: Target date: 09/26/2023  Patient will have cervical spine rotation to 60  deg in either direction and extension to 30 deg or greater without reproduction of pain indicative of sufficient ROM required for ADLs and self-care activities Baseline: 08/08/23: Mild limitation with R/L rotation and extension 18 deg Goal status: INITIAL  2.  Pt will independently demonstrate plumb line posture with external acoustic meatus no more than 2 cm anterior to ACJ during sitting/standing periscapular exercises indicative of improved postural awareness and less likelihood of recurrent neck pain episodes. Baseline: 08/08/23: Pt rests in significant C-spine protraction and kyphotic posture at rest, postural changes are largely flexible.  Goal status: INITIAL  3.  Pt will perform bed transfers/bed mobility (rolling, supine to sit/sitting to supine) without  increase in neck pain as needed for independent self-care and improved sleep quality Baseline: 08/08/23: Notable pain with getting up from her bed in L paracervical/upper quarter region.  Goal status: INITIAL    PLAN: PT FREQUENCY: 1-2x/week  PT DURATION: 6 weeks  PLANNED INTERVENTIONS: Therapeutic exercises, Therapeutic activity, Neuromuscular re-education, Balance training, Gait training, Patient/Family education, Self Care, Joint mobilization, Joint manipulation, Vestibular training, Canalith repositioning, Orthotic/Fit training, DME instructions, Dry Needling, Electrical stimulation, Spinal manipulation, Spinal mobilization, Cryotherapy, Moist heat, Taping, Traction, Ultrasound, Ionotophoresis 4mg /ml Dexamethasone, Manual therapy, and Re-evaluation.  PLAN FOR NEXT SESSION: Continue with manual therapy and mobilization techniques to improve C-spine ROM and L paracervical sensitivity. Progress with scap retraction/depression drills and postural re-education as tolerated.    Denese Finn, PT, DPT #[id number]  Aleatha Hunting, PT 08/20/2023, 12:38 PM

## 2023-08-20 ENCOUNTER — Ambulatory Visit: Admitting: Physical Therapy

## 2023-08-20 ENCOUNTER — Encounter: Payer: Self-pay | Admitting: Physical Therapy

## 2023-08-20 DIAGNOSIS — R293 Abnormal posture: Secondary | ICD-10-CM

## 2023-08-20 DIAGNOSIS — M542 Cervicalgia: Secondary | ICD-10-CM

## 2023-08-20 DIAGNOSIS — M6281 Muscle weakness (generalized): Secondary | ICD-10-CM

## 2023-08-27 ENCOUNTER — Ambulatory Visit: Admitting: Physical Therapy

## 2023-08-27 NOTE — Therapy (Deleted)
 OUTPATIENT PHYSICAL THERAPY NECK TREATMENT   Patient Name: Rachel [name] MRN: [id number] DOB:1960/06/09, 63 y.o., female Today's Date: 08/27/2023  END OF SESSION:      Past Medical History:  Diagnosis Date   Cerebral palsy (HCC)    Diabetes mellitus without complication (HCC)    Hypercholesteremia    Hypertension    Past Surgical History:  Procedure Laterality Date   DILATION AND CURETTAGE OF UTERUS     Patient Active Problem List   Diagnosis Date Noted   Porokeratosis 05/17/2022   Lower extremity edema 08/16/2020   Pain due to onychomycosis of toenails of both feet 02/02/2019   Acute gastroenteritis 01/26/2018   Diabetic polyneuropathy associated with type 2 diabetes mellitus (HCC) 09/12/2015   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   Aggression 07/13/2015   Palpitations 07/06/2015   Essential hypertension, benign 07/06/2015   Obesity 07/06/2015   Cerebral palsy (HCC) 12/24/2013   Controlled type 2 diabetes mellitus without complication (HCC) 12/24/2013   HLD (hyperlipidemia) 12/24/2013   BP (high blood pressure) 12/24/2013   Adiposity 12/24/2013   Peripheral nerve disease 12/24/2013    PCP: Little Riff, MD  REFERRING PROVIDER: Ludwig Safer, PA-C  REFERRING DIAG:  [phone] (ICD-10-CM) - Spinal stenosis in cervical region    RATIONALE FOR EVALUATION AND TREATMENT: Rehabilitation  THERAPY DIAG: Cervicalgia  Abnormal posture  Muscle weakness (generalized)  ONSET DATE: 6 months ago   FOLLOW-UP APPT SCHEDULED WITH REFERRING PROVIDER: Yes   Pertinent History Ms. Rachel [name] is here today with a history of cerebral palsy, moderate intellectual disability, diabetes, high cholesterol, and high blood pressure.   Pt reports pain along posterior cervical spine and pain along L upper trap region. Pt had pain about 6 months ago. Patient reports some episodes of dizziness where it is hard to see. Pt reports episode of lightheadedness with transferring  weeks ago. She thinks she may have gotten spinning sensation some weeks ago.   Pain:  Pain Intensity: Moderate pain per patient (difficulty assessing NPRS with intellectual disability) Pain location: Neck, L upper trap  Pain Quality: burning, "dizzy headed", stiff  Radiating: Yes ; LUE  Numbness/Tingling: Yes; NT affecting LUE/L hand Focal Weakness: Yes; some difficulty holding/gripping with L hand  Aggravating factors: lifting herself up out of bed Relieving factors: lying down 24-hour pain behavior: worse in AM  History of prior neck injury, pain, surgery, or therapy: Yes;  Dominant hand: right Imaging: Yes   C-spine CT without contrast (05/18/23)  CLINICAL DATA:  Neck trauma, impaired ROM (Age 10-64y)   EXAM: CT CERVICAL SPINE WITHOUT CONTRAST   TECHNIQUE: Multidetector CT imaging of the cervical spine was performed without intravenous contrast. Multiplanar CT image reconstructions were also generated.   RADIATION DOSE REDUCTION: This exam was performed according to the departmental dose-optimization program which includes automated exposure control, adjustment of the mA and/or kV according to patient size and/or use of iterative reconstruction technique.   COMPARISON:  04/18/2019   FINDINGS: Alignment: Facet joints are aligned without dislocation or traumatic listhesis. Dens and lateral masses are aligned.   Skull base and vertebrae: No acute fracture. No primary bone lesion or focal pathologic process.   Soft tissues and spinal canal: No prevertebral fluid or swelling. No visible canal hematoma.   Disc levels: Mild multilevel cervical spondylosis. Disc protrusion most pronounced at C3-C4.   Upper chest: Included lung apices are clear.   Other: Bilateral carotid atherosclerosis.   IMPRESSION: No acute fracture or traumatic listhesis of the cervical spine.  Electronically Signed   By: Leverne Reading D.O.   On: 05/18/2023 15:52  Red flags (personal  history of cancer, h/o spinal tumors, history of compression fracture, chills/fever, night sweats, nausea, vomiting, unrelenting pain): Negative  PRECAUTIONS: None  WEIGHT BEARING RESTRICTIONS: No  FALLS: Has patient fallen in last 6 months? No  -1 fall in last year (during 2024)  Living Environment Lives with: Pt has own bedroom in group home Lives in: Group home Stairs: One small step up to get into group home  Has following equipment at home: Grab bars installed in bathroom/shower  Prior level of function: Independent with household mobility without device  Occupational demands: -  Hobbies: Crafts with [employer]/activities with group home  Patient Goals: Get neck feeling better    OBJECTIVE:   Cognition Patient is oriented to person, place, and time.  Recent memory is intact.  Remote memory is difficult to obtain with some communication deficits.  Attention span and concentration are intact.  Expressive speech is intact.  Patient's fund of knowledge is limited due to lifelong CP/intellectual disability  Posture Cervical protraction, forward head; rounded shoulders; increased thoracic kyphosis  AROM  Shoulder AROM Flexion 110 bilat Abduction 120 bilat    AROM (Normal range in degrees) AROM 08/08/23  Cervical  Flexion (50) WNL  Extension (80) 18  Right lateral flexion (45) 20  Left lateral flexion (45) 21  Right rotation (85) 50  Left rotation (85) 53  (* = pain; Blank rows = not tested)   MMT MMT (out of 5) Right Left      Shoulder   Flexion 4- 4-  Extension    Abduction 4 4  Internal rotation 5 5  External rotation 4- 4-  Horizontal abduction    Horizontal adduction    Lower Trapezius    Rhomboids        Elbow  Flexion 5 5  Extension 4 4  Pronation    Supination        (* = pain; Blank rows = not tested)  Sensation Grossly intact to light touch bilateral UE as determined by testing dermatomes C2-T2. Proprioception and  hot/cold testing deferred on this date.   Reflexes Deferred   Palpation Location LEFT  RIGHT           Suboccipitals    Cervical paraspinals 1 0  Upper Trapezius 2 0  Levator Scapulae 1 0  Rhomboid Major/Minor 2 1  (Blank rows = not tested) Graded on 0-4 scale (0 = no pain, 1 = pain, 2 = pain with wincing/grimacing/flinching, 3 = pain with withdrawal, 4 = unwilling to allow palpation), (Blank rows = not tested)  Repeated Movements Deferred   Passive Accessory Intervertebral Motion Updated 08/15/23: Dec C5-7 CPA, hypomobile R to L CPA at C3-6 levels.      SPECIAL TESTS Updated 08/15/23:  -Spurlings A (ipsilateral lateral flexion/axial compression): Negative bilat -Distraction Test: Positive for pain relief Hoffman Sign (cervical cord compression): R: Negative L: Negative     TODAY'S TREATMENT    SUBJECTIVE STATEMENT:   Patient reports her edema has improved with use of Lasix. Pt denies notable pain this AM. Her caregiver reports no recent significant compalints of pain   There were no vitals filed for this visit.   Manual Therapy - for symptom modulation, soft tissue sensitivity and mobility, joint mobility, ROM   STM/DTM L upper trapezius and L>R cervical paraspinals C3-7; x 15 minutes Supine cervical traction, general, intermittent 10-second bouts; x 5  minutes R to L sideglide at C3-6, gr III for mobility; 2 x 30 sec bouts   Neuromuscular Re-education - for postural re-education, plumb line posture positional awareness/proprioception  Seated cervical rotation, alternating R/L; x10 ea dir Tband standing row with upright posture; 2x10 with Boyd Cabal    PATIENT/CAREGIVER EDUCATION: Discussed contribution of orthostatic hypotension to patient's dizziness/lightheadedness. Discussed potential referral or screen with vestibular provider if patient has ongoing issues with dizziness or dysequilibrium with lying down. HEP reviewed with pt and caregivers.   *not  today* Cervical retraction; 1 x 10 for HEP review Upper trap stretch; 2 x 30 sec bilat  -for UT inhibition   PATIENT EDUCATION:  Education details: see above for patient education details Person educated: Patient and Caregiver [place]ette Education method: Explanation, Demonstration, and Handouts Education comprehension: verbalized understanding, returned demonstration, verbal cues required, tactile cues required, and needs further education   HOME EXERCISE PROGRAM:  Access Code: [email or web address] URL: [email or web address] Date: 08/20/2023 Prepared by: Denese Finn  Exercises - Seated Cervical Retraction  - 2 x daily - 7 x weekly - 2 sets - 10 reps - Seated Upper Trapezius Stretch  - 2 x daily - 7 x weekly - 3 sets - 30sec hold - Seated Scapular Retraction  - 2 x daily - 7 x weekly - 2 sets - 10 reps - 3sec hold - Seated Cervical Rotation AROM  - 2 x daily - 7 x weekly - 2 sets - 10 reps   ASSESSMENT:  CLINICAL IMPRESSION: Patient is very pleasant and participates well with PT. She fortunately has minimal pain at baseline today, and she has not complained frequently about neck/upper quarter pain per caregiver. She does have some evidence of orthostatic hypotension which may be associated with pt taking diuretic medication; sensation of spinning with lying down may be due to other cause (CNS, vestibular system). Discussed with pt and caregiver potential for otbaining referral to ENT or completing vestibular screen prn if vertigo with lying persists. She tolerates exercises well without notable c/o pain. Pt will continue to benefit from skilled PT services to address deficits and improve function.  OBJECTIVE IMPAIRMENTS: decreased ROM, decreased strength, hypomobility, impaired flexibility, impaired UE functional use, postural dysfunction, and pain.   ACTIVITY LIMITATIONS: sleeping, transfers, bed mobility, and reach over head  PARTICIPATION LIMITATIONS: cleaning, community  activity, and participation in occupational group home activities  PERSONAL FACTORS: Age, Time since onset of injury/illness/exacerbation, and 3+ comorbidities: (cerebral palsy, HTN, peripheral nerve disease, HLD, Type 2 DM, moderate intellectual disability) are also affecting patient's functional outcome.   REHAB POTENTIAL: Fair given comorbidities and CP/intellectual disability  CLINICAL DECISION MAKING: Evolving/moderate complexity  EVALUATION COMPLEXITY: Moderate   GOALS: Goals reviewed with patient? Yes  SHORT TERM GOALS: Target date: 09/05/2023  Pt will be independent with HEP to improve strength and decrease neck pain to improve pain-free function at home and work. Baseline: 08/08/23: Baseline HEP initiated and reviewed; [employer] handout provided. Goal status: INITIAL   LONG TERM GOALS: Target date: 09/26/2023  Patient will have cervical spine rotation to 60 deg in either direction and extension to 30 deg or greater without reproduction of pain indicative of sufficient ROM required for ADLs and self-care activities Baseline: 08/08/23: Mild limitation with R/L rotation and extension 18 deg Goal status: INITIAL  2.  Pt will independently demonstrate plumb line posture with external acoustic meatus no more than 2 cm anterior to ACJ during sitting/standing periscapular exercises indicative of improved postural awareness and less likelihood of  recurrent neck pain episodes. Baseline: 08/08/23: Pt rests in significant C-spine protraction and kyphotic posture at rest, postural changes are largely flexible.  Goal status: INITIAL  3.  Pt will perform bed transfers/bed mobility (rolling, supine to sit/sitting to supine) without increase in neck pain as needed for independent self-care and improved sleep quality Baseline: 08/08/23: Notable pain with getting up from her bed in L paracervical/upper quarter region.  Goal status: INITIAL    PLAN: PT FREQUENCY: 1-2x/week  PT DURATION: 6  weeks  PLANNED INTERVENTIONS: Therapeutic exercises, Therapeutic activity, Neuromuscular re-education, Balance training, Gait training, Patient/Family education, Self Care, Joint mobilization, Joint manipulation, Vestibular training, Canalith repositioning, Orthotic/Fit training, DME instructions, Dry Needling, Electrical stimulation, Spinal manipulation, Spinal mobilization, Cryotherapy, Moist heat, Taping, Traction, Ultrasound, Ionotophoresis 4mg /ml Dexamethasone, Manual therapy, and Re-evaluation.  PLAN FOR NEXT SESSION: Continue with manual therapy and mobilization techniques to improve C-spine ROM and L paracervical sensitivity. Progress with scap retraction/depression drills and postural re-education as tolerated.    Denese Finn, PT, DPT #[id number]  Aleatha Hunting, PT 08/27/2023, 7:29 AM

## 2023-08-27 NOTE — Therapy (Unsigned)
 OUTPATIENT PHYSICAL THERAPY NECK TREATMENT   Patient Name: Rachel [name] MRN: [id number] DOB:1960-10-03, 63 y.o., female Today's Date: 08/28/2023  END OF SESSION:  PT End of Session - 08/28/23 1129     Visit Number 4    Number of Visits 13    Date for PT Re-Evaluation 09/19/23    PT Start Time 1131    PT Stop Time 1201    PT Time Calculation (min) 30 min    Activity Tolerance Patient tolerated treatment well                Past Medical History:  Diagnosis Date   Cerebral palsy (HCC)    Diabetes mellitus without complication (HCC)    Hypercholesteremia    Hypertension    Past Surgical History:  Procedure Laterality Date   DILATION AND CURETTAGE OF UTERUS     Patient Active Problem List   Diagnosis Date Noted   Porokeratosis 05/17/2022   Lower extremity edema 08/16/2020   Pain due to onychomycosis of toenails of both feet 02/02/2019   Acute gastroenteritis 01/26/2018   Diabetic polyneuropathy associated with type 2 diabetes mellitus (HCC) 09/12/2015   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   Aggression 07/13/2015   Palpitations 07/06/2015   Essential hypertension, benign 07/06/2015   Obesity 07/06/2015   Cerebral palsy (HCC) 12/24/2013   Controlled type 2 diabetes mellitus without complication (HCC) 12/24/2013   HLD (hyperlipidemia) 12/24/2013   BP (high blood pressure) 12/24/2013   Adiposity 12/24/2013   Peripheral nerve disease 12/24/2013    PCP: Little Riff, MD  REFERRING PROVIDER: Ludwig Safer, PA-C  REFERRING DIAG:  [phone] (ICD-10-CM) - Spinal stenosis in cervical region    RATIONALE FOR EVALUATION AND TREATMENT: Rehabilitation  THERAPY DIAG: Cervicalgia  Abnormal posture  Muscle weakness (generalized)  ONSET DATE: 6 months ago   FOLLOW-UP APPT SCHEDULED WITH REFERRING PROVIDER: Yes   Pertinent History Rachel [name] is here today with a history of cerebral palsy, moderate intellectual disability, diabetes, high  cholesterol, and high blood pressure.   Pt reports pain along posterior cervical spine and pain along L upper trap region. Pt had pain about 6 months ago. Patient reports some episodes of dizziness where it is hard to see. Pt reports episode of lightheadedness with transferring weeks ago. She thinks she may have gotten spinning sensation some weeks ago.   Pain:  Pain Intensity: Moderate pain per patient (difficulty assessing NPRS with intellectual disability) Pain location: Neck, L upper trap  Pain Quality: burning, "dizzy headed", stiff  Radiating: Yes ; LUE  Numbness/Tingling: Yes; NT affecting LUE/L hand Focal Weakness: Yes; some difficulty holding/gripping with L hand  Aggravating factors: lifting herself up out of bed Relieving factors: lying down 24-hour pain behavior: worse in AM  History of prior neck injury, pain, surgery, or therapy: Yes;  Dominant hand: right Imaging: Yes   C-spine CT without contrast (05/18/23)  CLINICAL DATA:  Neck trauma, impaired ROM (Age 81-64y)   EXAM: CT CERVICAL SPINE WITHOUT CONTRAST   TECHNIQUE: Multidetector CT imaging of the cervical spine was performed without intravenous contrast. Multiplanar CT image reconstructions were also generated.   RADIATION DOSE REDUCTION: This exam was performed according to the departmental dose-optimization program which includes automated exposure control, adjustment of the mA and/or kV according to patient size and/or use of iterative reconstruction technique.   COMPARISON:  04/18/2019   FINDINGS: Alignment: Facet joints are aligned without dislocation or traumatic listhesis. Dens and lateral masses are aligned.   Skull  base and vertebrae: No acute fracture. No primary bone lesion or focal pathologic process.   Soft tissues and spinal canal: No prevertebral fluid or swelling. No visible canal hematoma.   Disc levels: Mild multilevel cervical spondylosis. Disc protrusion most pronounced at C3-C4.    Upper chest: Included lung apices are clear.   Other: Bilateral carotid atherosclerosis.   IMPRESSION: No acute fracture or traumatic listhesis of the cervical spine.     Electronically Signed   By: Leverne Reading D.O.   On: 05/18/2023 15:52  Red flags (personal history of cancer, h/o spinal tumors, history of compression fracture, chills/fever, night sweats, nausea, vomiting, unrelenting pain): Negative  PRECAUTIONS: None  WEIGHT BEARING RESTRICTIONS: No  FALLS: Has patient fallen in last 6 months? No  -1 fall in last year (during 2024)  Living Environment Lives with: Pt has own bedroom in group home Lives in: Group home Stairs: One small step up to get into group home  Has following equipment at home: Grab bars installed in bathroom/shower  Prior level of function: Independent with household mobility without device  Occupational demands: -  Hobbies: Crafts with [employer]/activities with group home  Patient Goals: Get neck feeling better    OBJECTIVE:   Cognition Patient is oriented to person, place, and time.  Recent memory is intact.  Remote memory is difficult to obtain with some communication deficits.  Attention span and concentration are intact.  Expressive speech is intact.  Patient's fund of knowledge is limited due to lifelong CP/intellectual disability  Posture Cervical protraction, forward head; rounded shoulders; increased thoracic kyphosis  AROM  Shoulder AROM Flexion 110 bilat Abduction 120 bilat    AROM (Normal range in degrees) AROM 08/08/23  Cervical  Flexion (50) WNL  Extension (80) 18  Right lateral flexion (45) 20  Left lateral flexion (45) 21  Right rotation (85) 50  Left rotation (85) 53  (* = pain; Blank rows = not tested)   MMT MMT (out of 5) Right Left      Shoulder   Flexion 4- 4-  Extension    Abduction 4 4  Internal rotation 5 5  External rotation 4- 4-  Horizontal abduction    Horizontal  adduction    Lower Trapezius    Rhomboids        Elbow  Flexion 5 5  Extension 4 4  Pronation    Supination        (* = pain; Blank rows = not tested)  Sensation Grossly intact to light touch bilateral UE as determined by testing dermatomes C2-T2. Proprioception and hot/cold testing deferred on this date.   Reflexes Deferred   Palpation Location LEFT  RIGHT           Suboccipitals    Cervical paraspinals 1 0  Upper Trapezius 2 0  Levator Scapulae 1 0  Rhomboid Major/Minor 2 1  (Blank rows = not tested) Graded on 0-4 scale (0 = no pain, 1 = pain, 2 = pain with wincing/grimacing/flinching, 3 = pain with withdrawal, 4 = unwilling to allow palpation), (Blank rows = not tested)  Repeated Movements Deferred   Passive Accessory Intervertebral Motion Updated 08/15/23: Dec C5-7 CPA, hypomobile R to L CPA at C3-6 levels.      SPECIAL TESTS Updated 08/15/23:  -Spurlings A (ipsilateral lateral flexion/axial compression): Negative bilat -Distraction Test: Positive for pain relief Hoffman Sign (cervical cord compression): R: Negative L: Negative     TODAY'S TREATMENT    SUBJECTIVE STATEMENT:  Patient reports feeling good at arrival. Pt states she is doing well with lying in bed/turning over. Pt reports doing her stretches at home.    There were no vitals filed for this visit.    Cervical AROM rotation: R 52, L 55   Manual Therapy - for symptom modulation, soft tissue sensitivity and mobility, joint mobility, ROM   STM/DTM L upper trapezius and L>R cervical paraspinals C3-7; x 10 minutes R to L and L to R cervical sideglide at C3-6, gr III for mobility; 2 x 30 sec bouts TPR L upper trapezius; x 2 minutes  *not today* Supine cervical traction, general, intermittent 10-second bouts; x 5 minutes   Neuromuscular Re-education - for postural re-education, plumb line posture positional awareness/proprioception  Seated cervical rotation, alternating R/L; x10 ea  dir  -with maintenance of upright posture Tband standing row with upright posture; 2x10 with Blue Tband  -tactile cueing for scap retraction/depression Standing bilateral ER with scap retraction, back to wall; 2 x 10 with Red Tband    PATIENT/CAREGIVER EDUCATION: Discussed continued HEP and PT prognosis.   *not today* Cervical retraction; 1 x 10 for HEP review Upper trap stretch; 2 x 30 sec bilat  -for UT inhibition   PATIENT EDUCATION:  Education details: see above for patient education details Person educated: Patient and Caregiver [place] Education method: Explanation, Demonstration, and Handouts Education comprehension: verbalized understanding, returned demonstration, verbal cues required, tactile cues required, and needs further education   HOME EXERCISE PROGRAM:  Access Code: [email or web address] URL: [email or web address] Date: 08/20/2023 Prepared by: Denese Finn  Exercises - Seated Cervical Retraction  - 2 x daily - 7 x weekly - 2 sets - 10 reps - Seated Upper Trapezius Stretch  - 2 x daily - 7 x weekly - 3 sets - 30sec hold - Seated Scapular Retraction  - 2 x daily - 7 x weekly - 2 sets - 10 reps - 3sec hold - Seated Cervical Rotation AROM  - 2 x daily - 7 x weekly - 2 sets - 10 reps   ASSESSMENT:  CLINICAL IMPRESSION: Patient participates very well with PT. She has notable mild motion loss with bilat rotation that has not changed significantly. She tolerates forward flexion well without notable pain. Symptoms are fortunately minimal at arrival over last two sessions. We modestly increased volume of postural re-education work without notable discomfort today. Pt will continue to benefit from skilled PT services to address deficits and improve function.  OBJECTIVE IMPAIRMENTS: decreased ROM, decreased strength, hypomobility, impaired flexibility, impaired UE functional use, postural dysfunction, and pain.   ACTIVITY LIMITATIONS: sleeping, transfers, bed  mobility, and reach over head  PARTICIPATION LIMITATIONS: cleaning, community activity, and participation in occupational group home activities  PERSONAL FACTORS: Age, Time since onset of injury/illness/exacerbation, and 3+ comorbidities: (cerebral palsy, HTN, peripheral nerve disease, HLD, Type 2 DM, moderate intellectual disability) are also affecting patient's functional outcome.   REHAB POTENTIAL: Fair given comorbidities and CP/intellectual disability  CLINICAL DECISION MAKING: Evolving/moderate complexity  EVALUATION COMPLEXITY: Moderate   GOALS: Goals reviewed with patient? Yes  SHORT TERM GOALS: Target date: 09/05/2023  Pt will be independent with HEP to improve strength and decrease neck pain to improve pain-free function at home and work. Baseline: 08/08/23: Baseline HEP initiated and reviewed; [employer] handout provided. Goal status: INITIAL   LONG TERM GOALS: Target date: 09/26/2023  Patient will have cervical spine rotation to 60 deg in either direction and extension to 30 deg or greater without reproduction of pain indicative  of sufficient ROM required for ADLs and self-care activities Baseline: 08/08/23: Mild limitation with R/L rotation and extension 18 deg Goal status: INITIAL  2.  Pt will independently demonstrate plumb line posture with external acoustic meatus no more than 2 cm anterior to ACJ during sitting/standing periscapular exercises indicative of improved postural awareness and less likelihood of recurrent neck pain episodes. Baseline: 08/08/23: Pt rests in significant C-spine protraction and kyphotic posture at rest, postural changes are largely flexible.  Goal status: INITIAL  3.  Pt will perform bed transfers/bed mobility (rolling, supine to sit/sitting to supine) without increase in neck pain as needed for independent self-care and improved sleep quality Baseline: 08/08/23: Notable pain with getting up from her bed in L paracervical/upper quarter region.  Goal  status: INITIAL    PLAN: PT FREQUENCY: 1-2x/week  PT DURATION: 6 weeks  PLANNED INTERVENTIONS: Therapeutic exercises, Therapeutic activity, Neuromuscular re-education, Balance training, Gait training, Patient/Family education, Self Care, Joint mobilization, Joint manipulation, Vestibular training, Canalith repositioning, Orthotic/Fit training, DME instructions, Dry Needling, Electrical stimulation, Spinal manipulation, Spinal mobilization, Cryotherapy, Moist heat, Taping, Traction, Ultrasound, Ionotophoresis 4mg /ml Dexamethasone, Manual therapy, and Re-evaluation.  PLAN FOR NEXT SESSION: Continue with manual therapy and mobilization techniques to improve C-spine ROM and L paracervical sensitivity. Progress with scap retraction/depression drills and postural re-education as tolerated.    Denese Finn, PT, DPT #[id number]  Aleatha Hunting, PT 08/28/2023, 12:03 PM

## 2023-08-28 ENCOUNTER — Encounter: Admitting: Physical Therapy

## 2023-08-28 ENCOUNTER — Ambulatory Visit: Admitting: Physical Therapy

## 2023-08-28 DIAGNOSIS — M542 Cervicalgia: Secondary | ICD-10-CM | POA: Diagnosis not present

## 2023-08-28 DIAGNOSIS — R293 Abnormal posture: Secondary | ICD-10-CM

## 2023-08-28 DIAGNOSIS — M6281 Muscle weakness (generalized): Secondary | ICD-10-CM

## 2023-09-03 ENCOUNTER — Ambulatory Visit: Attending: Physician Assistant | Admitting: Physical Therapy

## 2023-09-03 DIAGNOSIS — R293 Abnormal posture: Secondary | ICD-10-CM | POA: Insufficient documentation

## 2023-09-03 DIAGNOSIS — M6281 Muscle weakness (generalized): Secondary | ICD-10-CM | POA: Diagnosis present

## 2023-09-03 DIAGNOSIS — M542 Cervicalgia: Secondary | ICD-10-CM | POA: Diagnosis present

## 2023-09-03 NOTE — Therapy (Signed)
 OUTPATIENT PHYSICAL THERAPY NECK TREATMENT   Patient Name: Rachel [name] MRN: [id number] DOB:1960/10/07, 63 y.o., female Today's Date: 09/03/2023  END OF SESSION:  PT End of Session - 09/03/23 0731     Visit Number 5    Number of Visits 13    Date for PT Re-Evaluation 09/19/23    PT Start Time 0731    PT Stop Time 0811    PT Time Calculation (min) 40 min    Activity Tolerance Patient tolerated treatment well                Past Medical History:  Diagnosis Date   Cerebral palsy (HCC)    Diabetes mellitus without complication (HCC)    Hypercholesteremia    Hypertension    Past Surgical History:  Procedure Laterality Date   DILATION AND CURETTAGE OF UTERUS     Patient Active Problem List   Diagnosis Date Noted   Porokeratosis 05/17/2022   Lower extremity edema 08/16/2020   Pain due to onychomycosis of toenails of both feet 02/02/2019   Acute gastroenteritis 01/26/2018   Diabetic polyneuropathy associated with type 2 diabetes mellitus (HCC) 09/12/2015   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   Aggression 07/13/2015   Palpitations 07/06/2015   Essential hypertension, benign 07/06/2015   Obesity 07/06/2015   Cerebral palsy (HCC) 12/24/2013   Controlled type 2 diabetes mellitus without complication (HCC) 12/24/2013   HLD (hyperlipidemia) 12/24/2013   BP (high blood pressure) 12/24/2013   Adiposity 12/24/2013   Peripheral nerve disease 12/24/2013    PCP: Little Riff, MD  REFERRING PROVIDER: Ludwig Safer, PA-C  REFERRING DIAG:  [phone] (ICD-10-CM) - Spinal stenosis in cervical region    RATIONALE FOR EVALUATION AND TREATMENT: Rehabilitation  THERAPY DIAG: Cervicalgia  Abnormal posture  Muscle weakness (generalized)  ONSET DATE: 6 months ago   FOLLOW-UP APPT SCHEDULED WITH REFERRING PROVIDER: Yes   Pertinent History Ms. Rachel [name] is here today with a history of cerebral palsy, moderate intellectual disability, diabetes, high  cholesterol, and high blood pressure.   Pt reports pain along posterior cervical spine and pain along L upper trap region. Pt had pain about 6 months ago. Patient reports some episodes of dizziness where it is hard to see. Pt reports episode of lightheadedness with transferring weeks ago. She thinks she may have gotten spinning sensation some weeks ago.   Pain:  Pain Intensity: Moderate pain per patient (difficulty assessing NPRS with intellectual disability) Pain location: Neck, L upper trap  Pain Quality: burning, "dizzy headed", stiff  Radiating: Yes ; LUE  Numbness/Tingling: Yes; NT affecting LUE/L hand Focal Weakness: Yes; some difficulty holding/gripping with L hand  Aggravating factors: lifting herself up out of bed Relieving factors: lying down 24-hour pain behavior: worse in AM  History of prior neck injury, pain, surgery, or therapy: Yes;  Dominant hand: right Imaging: Yes   C-spine CT without contrast (05/18/23)  CLINICAL DATA:  Neck trauma, impaired ROM (Age 51-64y)   EXAM: CT CERVICAL SPINE WITHOUT CONTRAST   TECHNIQUE: Multidetector CT imaging of the cervical spine was performed without intravenous contrast. Multiplanar CT image reconstructions were also generated.   RADIATION DOSE REDUCTION: This exam was performed according to the departmental dose-optimization program which includes automated exposure control, adjustment of the mA and/or kV according to patient size and/or use of iterative reconstruction technique.   COMPARISON:  04/18/2019   FINDINGS: Alignment: Facet joints are aligned without dislocation or traumatic listhesis. Dens and lateral masses are aligned.   Skull  base and vertebrae: No acute fracture. No primary bone lesion or focal pathologic process.   Soft tissues and spinal canal: No prevertebral fluid or swelling. No visible canal hematoma.   Disc levels: Mild multilevel cervical spondylosis. Disc protrusion most pronounced at C3-C4.    Upper chest: Included lung apices are clear.   Other: Bilateral carotid atherosclerosis.   IMPRESSION: No acute fracture or traumatic listhesis of the cervical spine.     Electronically Signed   By: Leverne Reading D.O.   On: 05/18/2023 15:52  Red flags (personal history of cancer, h/o spinal tumors, history of compression fracture, chills/fever, night sweats, nausea, vomiting, unrelenting pain): Negative  PRECAUTIONS: None  WEIGHT BEARING RESTRICTIONS: No  FALLS: Has patient fallen in last 6 months? No  -1 fall in last year (during 2024)  Living Environment Lives with: Pt has own bedroom in group home Lives in: Group home Stairs: One small step up to get into group home  Has following equipment at home: Grab bars installed in bathroom/shower  Prior level of function: Independent with household mobility without device  Occupational demands: -  Hobbies: Crafts with [employer]/activities with group home  Patient Goals: Get neck feeling better    OBJECTIVE:   Cognition Patient is oriented to person, place, and time.  Recent memory is intact.  Remote memory is difficult to obtain with some communication deficits.  Attention span and concentration are intact.  Expressive speech is intact.  Patient's fund of knowledge is limited due to lifelong CP/intellectual disability  Posture Cervical protraction, forward head; rounded shoulders; increased thoracic kyphosis  AROM  Shoulder AROM Flexion 110 bilat Abduction 120 bilat    AROM (Normal range in degrees) AROM 08/08/23  Cervical  Flexion (50) WNL  Extension (80) 18  Right lateral flexion (45) 20  Left lateral flexion (45) 21  Right rotation (85) 50  Left rotation (85) 53  (* = pain; Blank rows = not tested)   MMT MMT (out of 5) Right Left      Shoulder   Flexion 4- 4-  Extension    Abduction 4 4  Internal rotation 5 5  External rotation 4- 4-  Horizontal abduction    Horizontal  adduction    Lower Trapezius    Rhomboids        Elbow  Flexion 5 5  Extension 4 4  Pronation    Supination        (* = pain; Blank rows = not tested)  Sensation Grossly intact to light touch bilateral UE as determined by testing dermatomes C2-T2. Proprioception and hot/cold testing deferred on this date.   Reflexes Deferred   Palpation Location LEFT  RIGHT           Suboccipitals    Cervical paraspinals 1 0  Upper Trapezius 2 0  Levator Scapulae 1 0  Rhomboid Major/Minor 2 1  (Blank rows = not tested) Graded on 0-4 scale (0 = no pain, 1 = pain, 2 = pain with wincing/grimacing/flinching, 3 = pain with withdrawal, 4 = unwilling to allow palpation), (Blank rows = not tested)  Repeated Movements Deferred   Passive Accessory Intervertebral Motion Updated 08/15/23: Dec C5-7 CPA, hypomobile R to L CPA at C3-6 levels.      SPECIAL TESTS Updated 08/15/23:  -Spurlings A (ipsilateral lateral flexion/axial compression): Negative bilat -Distraction Test: Positive for pain relief Hoffman Sign (cervical cord compression): R: Negative L: Negative     TODAY'S TREATMENT    SUBJECTIVE STATEMENT:  Patient reports feeling generally good this AM. Pt arrives eager to participate in PT. Pt and her aides have been encouraging slow transfers and allowing for any lightheadedness to resolve before ambulating (supine to sit, sit to stand) to limit orthostatic hypotension symptoms.    There were no vitals filed for this visit.    Cervical AROM: flexion WNL, extension 26; rotation: R 53, L 55   Manual Therapy - for symptom modulation, soft tissue sensitivity and mobility, joint mobility, ROM   STM/DTM L upper trapezius and L>R cervical paraspinals C3-7; x 15 minutes R to L and L to R cervical sideglide at C3-6, gr III for mobility; 2 x 30 sec bouts TPR L upper trapezius; x 3 minutes  *not today* Supine cervical traction, general, intermittent 10-second bouts; x 5  minutes   Neuromuscular Re-education - for postural re-education, plumb line posture positional awareness/proprioception  Seated cervical rotation, alternating R/L; reviewed Tband standing row with upright posture; 2x10 with Blue Tband  -tactile cueing for scap retraction/depression and upright posture Standing bilateral ER with scap retraction, back to wall; 2 x 10 with Red Tband    PATIENT/CAREGIVER EDUCATION: Discussed current progress, likely being able to transition to home-based program over next 1-2 weeks, and current plan of care. Bands provided for periscapular isotonics at home.   *not today* Cervical retraction; 1 x 10 for HEP review Upper trap stretch; 2 x 30 sec bilat  -for UT inhibition   PATIENT EDUCATION:  Education details: see above for patient education details Person educated: Patient and Caregiver [place] Education method: Explanation, Demonstration, and Handouts Education comprehension: verbalized understanding, returned demonstration, verbal cues required, tactile cues required, and needs further education   HOME EXERCISE PROGRAM:  Access Code: [email or web address] URL: [email or web address] Date: 08/20/2023 Prepared by: Denese Finn  Exercises - Seated Cervical Retraction  - 2 x daily - 7 x weekly - 2 sets - 10 reps - Seated Upper Trapezius Stretch  - 2 x daily - 7 x weekly - 3 sets - 30sec hold - Seated Scapular Retraction  - 2 x daily - 7 x weekly - 2 sets - 10 reps - 3sec hold - Seated Cervical Rotation AROM  - 2 x daily - 7 x weekly - 2 sets - 10 reps   ASSESSMENT:  CLINICAL IMPRESSION: Patient participates very well with PT. Patient exhibits mild ROM deficits remaining for extension and rotation. She fortunately has minimal symptoms and no significant c/o of any functional deficits per aides in her group home or per patient. Pt has made good progress to date, but she does have some stiffness in cervical spine and will benefit from continued  ROM work at home and manual therapy/exercise to ensure functional ROM. Pt will continue to benefit from skilled PT services to address deficits and improve function.  OBJECTIVE IMPAIRMENTS: decreased ROM, decreased strength, hypomobility, impaired flexibility, impaired UE functional use, postural dysfunction, and pain.   ACTIVITY LIMITATIONS: sleeping, transfers, bed mobility, and reach over head  PARTICIPATION LIMITATIONS: cleaning, community activity, and participation in occupational group home activities  PERSONAL FACTORS: Age, Time since onset of injury/illness/exacerbation, and 3+ comorbidities: (cerebral palsy, HTN, peripheral nerve disease, HLD, Type 2 DM, moderate intellectual disability) are also affecting patient's functional outcome.   REHAB POTENTIAL: Fair given comorbidities and CP/intellectual disability  CLINICAL DECISION MAKING: Evolving/moderate complexity  EVALUATION COMPLEXITY: Moderate   GOALS: Goals reviewed with patient? Yes  SHORT TERM GOALS: Target date: 09/05/2023  Pt will be independent with HEP to improve  strength and decrease neck pain to improve pain-free function at home and work. Baseline: 08/08/23: Baseline HEP initiated and reviewed; [employer] handout provided. Goal status: INITIAL   LONG TERM GOALS: Target date: 09/26/2023  Patient will have cervical spine rotation to 60 deg in either direction and extension to 30 deg or greater without reproduction of pain indicative of sufficient ROM required for ADLs and self-care activities Baseline: 08/08/23: Mild limitation with R/L rotation and extension 18 deg Goal status: INITIAL  2.  Pt will independently demonstrate plumb line posture with external acoustic meatus no more than 2 cm anterior to ACJ during sitting/standing periscapular exercises indicative of improved postural awareness and less likelihood of recurrent neck pain episodes. Baseline: 08/08/23: Pt rests in significant C-spine protraction and kyphotic  posture at rest, postural changes are largely flexible.  Goal status: INITIAL  3.  Pt will perform bed transfers/bed mobility (rolling, supine to sit/sitting to supine) without increase in neck pain as needed for independent self-care and improved sleep quality Baseline: 08/08/23: Notable pain with getting up from her bed in L paracervical/upper quarter region.  Goal status: INITIAL    PLAN: PT FREQUENCY: 1-2x/week  PT DURATION: 6 weeks  PLANNED INTERVENTIONS: Therapeutic exercises, Therapeutic activity, Neuromuscular re-education, Balance training, Gait training, Patient/Family education, Self Care, Joint mobilization, Joint manipulation, Vestibular training, Canalith repositioning, Orthotic/Fit training, DME instructions, Dry Needling, Electrical stimulation, Spinal manipulation, Spinal mobilization, Cryotherapy, Moist heat, Taping, Traction, Ultrasound, Ionotophoresis 4mg /ml Dexamethasone, Manual therapy, and Re-evaluation.  PLAN FOR NEXT SESSION: Continue with manual therapy and mobilization techniques to improve C-spine ROM and L paracervical sensitivity. Progress with scap retraction/depression drills and postural re-education as tolerated.    Denese Finn, PT, DPT #[id number]  Aleatha Hunting, PT 09/03/2023, 7:31 AM

## 2023-09-10 ENCOUNTER — Ambulatory Visit: Admitting: Physical Therapy

## 2023-09-10 ENCOUNTER — Encounter: Payer: Self-pay | Admitting: Physical Therapy

## 2023-09-10 DIAGNOSIS — M6281 Muscle weakness (generalized): Secondary | ICD-10-CM

## 2023-09-10 DIAGNOSIS — M542 Cervicalgia: Secondary | ICD-10-CM

## 2023-09-10 DIAGNOSIS — R293 Abnormal posture: Secondary | ICD-10-CM

## 2023-09-10 NOTE — Therapy (Signed)
 OUTPATIENT PHYSICAL THERAPY NECK TREATMENT   Patient Name: Rachel [name] MRN: [id number] DOB:[birth year], 63 y.o., female Today's Date: 09/10/2023  END OF SESSION:  PT End of Session - 09/10/23 0740     Visit Number 6    Number of Visits 13    Date for PT Re-Evaluation 09/19/23    PT Start Time 0738    PT Stop Time 0818    PT Time Calculation (min) 40 min    Activity Tolerance Patient tolerated treatment well             Past Medical History:  Diagnosis Date   Cerebral palsy (HCC)    Diabetes mellitus without complication (HCC)    Hypercholesteremia    Hypertension    Past Surgical History:  Procedure Laterality Date   DILATION AND CURETTAGE OF UTERUS     Patient Active Problem List   Diagnosis Date Noted   Porokeratosis 05/17/2022   Lower extremity edema 08/16/2020   Pain due to onychomycosis of toenails of both feet 02/02/2019   Acute gastroenteritis 01/26/2018   Diabetic polyneuropathy associated with type 2 diabetes mellitus (HCC) 09/12/2015   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   Aggression 07/13/2015   Palpitations 07/06/2015   Essential hypertension, benign 07/06/2015   Obesity 07/06/2015   Cerebral palsy (HCC) 12/24/2013   Controlled type 2 diabetes mellitus without complication (HCC) 12/24/2013   HLD (hyperlipidemia) 12/24/2013   BP (high blood pressure) 12/24/2013   Adiposity 12/24/2013   Peripheral nerve disease 12/24/2013    PCP: Little Riff, MD  REFERRING PROVIDER: Ludwig Safer, PA-C  REFERRING DIAG:  [phone] (ICD-10-CM) - Spinal stenosis in cervical region    RATIONALE FOR EVALUATION AND TREATMENT: Rehabilitation  THERAPY DIAG: Cervicalgia  Abnormal posture  Muscle weakness (generalized)  ONSET DATE: 6 months ago   FOLLOW-UP APPT SCHEDULED WITH REFERRING PROVIDER: Yes   Pertinent History Ms. Rachel [name] is here today with a history of cerebral palsy, moderate intellectual disability, diabetes, high cholesterol,  and high blood pressure.   Pt reports pain along posterior cervical spine and pain along L upper trap region. Pt had pain about 6 months ago. Patient reports some episodes of dizziness where it is hard to see. Pt reports episode of lightheadedness with transferring weeks ago. She thinks she may have gotten spinning sensation some weeks ago.   Pain:  Pain Intensity: Moderate pain per patient (difficulty assessing NPRS with intellectual disability) Pain location: Neck, L upper trap  Pain Quality: burning, "dizzy headed", stiff  Radiating: Yes ; LUE  Numbness/Tingling: Yes; NT affecting LUE/L hand Focal Weakness: Yes; some difficulty holding/gripping with L hand  Aggravating factors: lifting herself up out of bed Relieving factors: lying down 24-hour pain behavior: worse in AM  History of prior neck injury, pain, surgery, or therapy: Yes;  Dominant hand: right Imaging: Yes   C-spine CT without contrast (05/18/23)  CLINICAL DATA:  Neck trauma, impaired ROM (Age 67-64y)   EXAM: CT CERVICAL SPINE WITHOUT CONTRAST   TECHNIQUE: Multidetector CT imaging of the cervical spine was performed without intravenous contrast. Multiplanar CT image reconstructions were also generated.   RADIATION DOSE REDUCTION: This exam was performed according to the departmental dose-optimization program which includes automated exposure control, adjustment of the mA and/or kV according to patient size and/or use of iterative reconstruction technique.   COMPARISON:  04/18/2019   FINDINGS: Alignment: Facet joints are aligned without dislocation or traumatic listhesis. Dens and lateral masses are aligned.   Skull base and vertebrae:  No acute fracture. No primary bone lesion or focal pathologic process.   Soft tissues and spinal canal: No prevertebral fluid or swelling. No visible canal hematoma.   Disc levels: Mild multilevel cervical spondylosis. Disc protrusion most pronounced at C3-C4.   Upper  chest: Included lung apices are clear.   Other: Bilateral carotid atherosclerosis.   IMPRESSION: No acute fracture or traumatic listhesis of the cervical spine.     Electronically Signed   By: Leverne Reading D.O.   On: 05/18/2023 15:52  Red flags (personal history of cancer, h/o spinal tumors, history of compression fracture, chills/fever, night sweats, nausea, vomiting, unrelenting pain): Negative  PRECAUTIONS: None  WEIGHT BEARING RESTRICTIONS: No  FALLS: Has patient fallen in last 6 months? No  -1 fall in last year (during 2024)  Living Environment Lives with: Pt has own bedroom in group home Lives in: Group home Stairs: One small step up to get into group home  Has following equipment at home: Grab bars installed in bathroom/shower  Prior level of function: Independent with household mobility without device  Occupational demands: -  Hobbies: Crafts with [employer]/activities with group home  Patient Goals: Get neck feeling better    OBJECTIVE:   Cognition Patient is oriented to person, place, and time.  Recent memory is intact.  Remote memory is difficult to obtain with some communication deficits.  Attention span and concentration are intact.  Expressive speech is intact.  Patient's fund of knowledge is limited due to lifelong CP/intellectual disability  Posture Cervical protraction, forward head; rounded shoulders; increased thoracic kyphosis  AROM  Shoulder AROM Flexion 110 bilat Abduction 120 bilat    AROM (Normal range in degrees) AROM 08/08/23  Cervical  Flexion (50) WNL  Extension (80) 18  Right lateral flexion (45) 20  Left lateral flexion (45) 21  Right rotation (85) 50  Left rotation (85) 53  (* = pain; Blank rows = not tested)   MMT MMT (out of 5) Right Left      Shoulder   Flexion 4- 4-  Extension    Abduction 4 4  Internal rotation 5 5  External rotation 4- 4-  Horizontal abduction    Horizontal adduction     Lower Trapezius    Rhomboids        Elbow  Flexion 5 5  Extension 4 4  Pronation    Supination        (* = pain; Blank rows = not tested)  Sensation Grossly intact to light touch bilateral UE as determined by testing dermatomes C2-T2. Proprioception and hot/cold testing deferred on this date.   Reflexes Deferred   Palpation Location LEFT  RIGHT           Suboccipitals    Cervical paraspinals 1 0  Upper Trapezius 2 0  Levator Scapulae 1 0  Rhomboid Major/Minor 2 1  (Blank rows = not tested) Graded on 0-4 scale (0 = no pain, 1 = pain, 2 = pain with wincing/grimacing/flinching, 3 = pain with withdrawal, 4 = unwilling to allow palpation), (Blank rows = not tested)  Repeated Movements Deferred   Passive Accessory Intervertebral Motion Updated 08/15/23: Dec C5-7 CPA, hypomobile R to L CPA at C3-6 levels.      SPECIAL TESTS Updated 08/15/23:  -Spurlings A (ipsilateral lateral flexion/axial compression): Negative bilat -Distraction Test: Positive for pain relief Hoffman Sign (cervical cord compression): R: Negative L: Negative     TODAY'S TREATMENT    SUBJECTIVE STATEMENT:   Patient  reports feeling good this AM. She reports working on her exercises at home and completing walking on most days.     Cervical AROM: flexion WNL, extension 29; rotation: R 52, L 61   Manual Therapy - for symptom modulation, soft tissue sensitivity and mobility, joint mobility, ROM   STM/DTM L upper trapezius and L>R cervical paraspinals C3-7; x 15 minutes R to L and L to R cervical sideglide at C3-6, gr III for mobility; 2 x 30 sec bouts TPR L upper trapezius; x 3 minutes Gentle passive rotation with contralateral facet anterior glide; x 10 in either direction  *not today* Supine cervical traction, general, intermittent 10-second bouts; x 5 minutes   Neuromuscular Re-education - for postural re-education, plumb line posture positional awareness/proprioception  Bruegger's postural  endurance, seated; x 10, 10 sec hold with Red Tband  -ModA with setup and PT performing alongside patient for technique carryover Tband standing shoulder extension with upright posture; 2x10 with Green Tband  -tactile cueing for scap retraction/depression and upright posture    PATIENT/CAREGIVER EDUCATION: Discussed current progress, likely being able to transition to home-based program over next 1-2 weeks, and current plan of care.   *not today* Standing bilateral ER with scap retraction, back to wall; 2 x 10 with Red Tband Seated cervical rotation, alternating R/L; reviewed Cervical retraction; 1 x 10 for HEP review Upper trap stretch; 2 x 30 sec bilat  -for UT inhibition   PATIENT EDUCATION:  Education details: see above for patient education details Person educated: Patient and Caregiver [place] Education method: Explanation, Demonstration, and Handouts Education comprehension: verbalized understanding, returned demonstration, verbal cues required, tactile cues required, and needs further education   HOME EXERCISE PROGRAM:  Access Code: [email or web address] URL: [email or web address] Date: 08/20/2023 Prepared by: Denese Finn  Exercises - Seated Cervical Retraction  - 2 x daily - 7 x weekly - 2 sets - 10 reps - Seated Upper Trapezius Stretch  - 2 x daily - 7 x weekly - 3 sets - 30sec hold - Seated Scapular Retraction  - 2 x daily - 7 x weekly - 2 sets - 10 reps - 3sec hold - Seated Cervical Rotation AROM  - 2 x daily - 7 x weekly - 2 sets - 10 reps   ASSESSMENT:  CLINICAL IMPRESSION: Patient presents with cervical spine AROM on cusp of functional ROM for extension and R rotation; L rotation is WFL at baseline today. She reports generally feeling well over last 2 weeks and is participating well with group home activities and ADLs without significant issue. Primary impairment is postural dysfunction and remaining deficits in ROM/C-spine stiffness. Pt will continue to  benefit from skilled PT services to address deficits and improve function.  OBJECTIVE IMPAIRMENTS: decreased ROM, decreased strength, hypomobility, impaired flexibility, impaired UE functional use, postural dysfunction, and pain.   ACTIVITY LIMITATIONS: sleeping, transfers, bed mobility, and reach over head  PARTICIPATION LIMITATIONS: cleaning, community activity, and participation in occupational group home activities  PERSONAL FACTORS: Age, Time since onset of injury/illness/exacerbation, and 3+ comorbidities: (cerebral palsy, HTN, peripheral nerve disease, HLD, Type 2 DM, moderate intellectual disability) are also affecting patient's functional outcome.   REHAB POTENTIAL: Fair given comorbidities and CP/intellectual disability  CLINICAL DECISION MAKING: Evolving/moderate complexity  EVALUATION COMPLEXITY: Moderate   GOALS: Goals reviewed with patient? Yes  SHORT TERM GOALS: Target date: 09/05/2023  Pt will be independent with HEP to improve strength and decrease neck pain to improve pain-free function at home and work. Baseline: 08/08/23: Baseline HEP  initiated and reviewed; [employer] handout provided. Goal status: INITIAL   LONG TERM GOALS: Target date: 09/26/2023  Patient will have cervical spine rotation to 60 deg in either direction and extension to 30 deg or greater without reproduction of pain indicative of sufficient ROM required for ADLs and self-care activities Baseline: 08/08/23: Mild limitation with R/L rotation and extension 18 deg Goal status: INITIAL  2.  Pt will independently demonstrate plumb line posture with external acoustic meatus no more than 2 cm anterior to ACJ during sitting/standing periscapular exercises indicative of improved postural awareness and less likelihood of recurrent neck pain episodes. Baseline: 08/08/23: Pt rests in significant C-spine protraction and kyphotic posture at rest, postural changes are largely flexible.  Goal status: INITIAL  3.  Pt  will perform bed transfers/bed mobility (rolling, supine to sit/sitting to supine) without increase in neck pain as needed for independent self-care and improved sleep quality Baseline: 08/08/23: Notable pain with getting up from her bed in L paracervical/upper quarter region.  Goal status: INITIAL    PLAN: PT FREQUENCY: 1-2x/week  PT DURATION: 6 weeks  PLANNED INTERVENTIONS: Therapeutic exercises, Therapeutic activity, Neuromuscular re-education, Balance training, Gait training, Patient/Family education, Self Care, Joint mobilization, Joint manipulation, Vestibular training, Canalith repositioning, Orthotic/Fit training, DME instructions, Dry Needling, Electrical stimulation, Spinal manipulation, Spinal mobilization, Cryotherapy, Moist heat, Taping, Traction, Ultrasound, Ionotophoresis 4mg /ml Dexamethasone, Manual therapy, and Re-evaluation.  PLAN FOR NEXT SESSION: Continue with manual therapy and mobilization techniques to improve C-spine ROM and L paracervical sensitivity. Progress with scap retraction/depression drills and postural re-education as tolerated.    Denese Finn, PT, DPT #[id number]  Aleatha Hunting, PT 09/10/2023, 8:32 AM

## 2023-09-13 ENCOUNTER — Other Ambulatory Visit

## 2023-09-17 ENCOUNTER — Ambulatory Visit: Admitting: Physical Therapy

## 2023-09-17 DIAGNOSIS — M6281 Muscle weakness (generalized): Secondary | ICD-10-CM

## 2023-09-17 DIAGNOSIS — M542 Cervicalgia: Secondary | ICD-10-CM | POA: Diagnosis not present

## 2023-09-17 DIAGNOSIS — R293 Abnormal posture: Secondary | ICD-10-CM

## 2023-09-17 NOTE — Therapy (Signed)
 OUTPATIENT PHYSICAL THERAPY NECK TREATMENT/GOAL UPDATE   Patient Name: Rachel [name] MRN: [id number] DOB:1960-04-15, 63 y.o., female Today's Date: 09/17/2023  END OF SESSION:  PT End of Session - 09/17/23 0741     Visit Number 7    Number of Visits 13    Date for PT Re-Evaluation 09/19/23    PT Start Time 0743    PT Stop Time 0825    PT Time Calculation (min) 42 min    Activity Tolerance Patient tolerated treatment well           Past Medical History:  Diagnosis Date   Cerebral palsy (HCC)    Diabetes mellitus without complication (HCC)    Hypercholesteremia    Hypertension    Past Surgical History:  Procedure Laterality Date   DILATION AND CURETTAGE OF UTERUS     Patient Active Problem List   Diagnosis Date Noted   Porokeratosis 05/17/2022   Lower extremity edema 08/16/2020   Pain due to onychomycosis of toenails of both feet 02/02/2019   Acute gastroenteritis 01/26/2018   Diabetic polyneuropathy associated with type 2 diabetes mellitus (HCC) 09/12/2015   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   Aggression 07/13/2015   Palpitations 07/06/2015   Essential hypertension, benign 07/06/2015   Obesity 07/06/2015   Cerebral palsy (HCC) 12/24/2013   Controlled type 2 diabetes mellitus without complication (HCC) 12/24/2013   HLD (hyperlipidemia) 12/24/2013   BP (high blood pressure) 12/24/2013   Adiposity 12/24/2013   Peripheral nerve disease 12/24/2013    PCP: Little Riff, MD  REFERRING PROVIDER: Ludwig Safer, PA-C  REFERRING DIAG:  [phone] (ICD-10-CM) - Spinal stenosis in cervical region    RATIONALE FOR EVALUATION AND TREATMENT: Rehabilitation  THERAPY DIAG: Cervicalgia  Abnormal posture  Muscle weakness (generalized)  ONSET DATE: 6 months ago   FOLLOW-UP APPT SCHEDULED WITH REFERRING PROVIDER: Yes   Pertinent History Rachel [name] is here today with a history of cerebral palsy, moderate intellectual disability, diabetes, high  cholesterol, and high blood pressure.   Pt reports pain along posterior cervical spine and pain along L upper trap region. Pt had pain about 6 months ago. Patient reports some episodes of dizziness where it is hard to see. Pt reports episode of lightheadedness with transferring weeks ago. She thinks she may have gotten spinning sensation some weeks ago.   Pain:  Pain Intensity: Moderate pain per patient (difficulty assessing NPRS with intellectual disability) Pain location: Neck, L upper trap  Pain Quality: burning, dizzy headed, stiff  Radiating: Yes ; LUE  Numbness/Tingling: Yes; NT affecting LUE/L hand Focal Weakness: Yes; some difficulty holding/gripping with L hand  Aggravating factors: lifting herself up out of bed Relieving factors: lying down 24-hour pain behavior: worse in AM  History of prior neck injury, pain, surgery, or therapy: Yes;  Dominant hand: right Imaging: Yes   C-spine CT without contrast (05/18/23)  CLINICAL DATA:  Neck trauma, impaired ROM (Age 62-64y)   EXAM: CT CERVICAL SPINE WITHOUT CONTRAST   TECHNIQUE: Multidetector CT imaging of the cervical spine was performed without intravenous contrast. Multiplanar CT image reconstructions were also generated.   RADIATION DOSE REDUCTION: This exam was performed according to the departmental dose-optimization program which includes automated exposure control, adjustment of the mA and/or kV according to patient size and/or use of iterative reconstruction technique.   COMPARISON:  04/18/2019   FINDINGS: Alignment: Facet joints are aligned without dislocation or traumatic listhesis. Dens and lateral masses are aligned.   Skull base and vertebrae: No  acute fracture. No primary bone lesion or focal pathologic process.   Soft tissues and spinal canal: No prevertebral fluid or swelling. No visible canal hematoma.   Disc levels: Mild multilevel cervical spondylosis. Disc protrusion most pronounced at C3-C4.    Upper chest: Included lung apices are clear.   Other: Bilateral carotid atherosclerosis.   IMPRESSION: No acute fracture or traumatic listhesis of the cervical spine.     Electronically Signed   By: Leverne Reading D.O.   On: 05/18/2023 15:52  Red flags (personal history of cancer, h/o spinal tumors, history of compression fracture, chills/fever, night sweats, nausea, vomiting, unrelenting pain): Negative  PRECAUTIONS: None  WEIGHT BEARING RESTRICTIONS: No  FALLS: Has patient fallen in last 6 months? No  -1 fall in last year (during 2024)  Living Environment Lives with: Pt has own bedroom in group home Lives in: Group home Stairs: One small step up to get into group home  Has following equipment at home: Grab bars installed in bathroom/shower  Prior level of function: Independent with household mobility without device  Occupational demands: -  Hobbies: Crafts with [employer]/activities with group home  Patient Goals: Get neck feeling better    OBJECTIVE:   Cognition Patient is oriented to person, place, and time.  Recent memory is intact.  Remote memory is difficult to obtain with some communication deficits.  Attention span and concentration are intact.  Expressive speech is intact.  Patient's fund of knowledge is limited due to lifelong CP/intellectual disability  Posture Cervical protraction, forward head; rounded shoulders; increased thoracic kyphosis  AROM  Shoulder AROM Flexion 110 bilat Abduction 120 bilat    AROM (Normal range in degrees) AROM 08/08/23 AROM 09/17/23  Cervical   Flexion (50) WNL WNL  Extension (80) 18 23  Right lateral flexion (45) 20   Left lateral flexion (45) 21   Right rotation (85) 50 55  Left rotation (85) 53 53  (* = pain; Blank rows = not tested)   MMT MMT (out of 5) Right Left      Shoulder   Flexion 4- 4-  Extension    Abduction 4 4  Internal rotation 5 5  External rotation 4- 4-  Horizontal  abduction    Horizontal adduction    Lower Trapezius    Rhomboids        Elbow  Flexion 5 5  Extension 4 4  Pronation    Supination        (* = pain; Blank rows = not tested)  Sensation Grossly intact to light touch bilateral UE as determined by testing dermatomes C2-T2. Proprioception and hot/cold testing deferred on this date.   Reflexes Deferred   Palpation Location LEFT  RIGHT           Suboccipitals    Cervical paraspinals 1 0  Upper Trapezius 2 0  Levator Scapulae 1 0  Rhomboid Major/Minor 2 1  (Blank rows = not tested) Graded on 0-4 scale (0 = no pain, 1 = pain, 2 = pain with wincing/grimacing/flinching, 3 = pain with withdrawal, 4 = unwilling to allow palpation), (Blank rows = not tested)  Repeated Movements Deferred   Passive Accessory Intervertebral Motion Updated 08/15/23: Dec C5-7 CPA, hypomobile R to L CPA at C3-6 levels.      SPECIAL TESTS Updated 08/15/23:  -Spurlings A (ipsilateral lateral flexion/axial compression): Negative bilat -Distraction Test: Positive for pain relief Hoffman Sign (cervical cord compression): R: Negative L: Negative     TODAY'S TREATMENT  SUBJECTIVE STATEMENT:   Patient reports feeling good this AM. Patient reports working on her exercises in group home. Patient/caregiver report no notable limitations with performance in occupational education or with ADLs/self-care.     *GOAL UPDATE PERFORMED   Manual Therapy - for symptom modulation, soft tissue sensitivity and mobility, joint mobility, ROM   STM/DTM L upper trapezius and L>R cervical paraspinals C3-7; x 10 minutes R to L and L to R cervical sideglide at C3-6, gr III for mobility; 2 x 30 sec bouts Gentle passive rotation with contralateral facet anterior glide; x 10 in either direction  *not today* Supine cervical traction, general, intermittent 10-second bouts; x 5 minutes TPR L upper trapezius; x 3 minutes  Neuromuscular Re-education - for postural  re-education, plumb line posture positional awareness/proprioception   Tband standing shoulder row with upright posture; 1x10 with Green Tband  -tactile cueing for scap retraction/depression and upright posture Tband standing shoulder row with upright posture; 1x10 with Blue Tband  -tactile cueing for scap retraction/depression and upright posture  Tband bilateral ER with scap retraction; Red Tband; 1x10 with Red Tband  -for HEP review  -tactile cueing and demo for technique and upright standing  PATIENT/CAREGIVER EDUCATION: Discussed current progress, appropriateness for HEP versus formal PT intervention, f/u with PT if condition regresses.    *not today* Standing bilateral ER with scap retraction, back to wall; 2 x 10 with Red Tband Seated cervical rotation, alternating R/L; reviewed Cervical retraction; 1 x 10 for HEP review Upper trap stretch; 2 x 30 sec bilat  -for UT inhibition   PATIENT EDUCATION:  Education details: see above for patient education details Person educated: Patient and Caregiver [place] Education method: Explanation, Demonstration, and Handouts Education comprehension: verbalized understanding, returned demonstration, verbal cues required, tactile cues required, and needs further education   HOME EXERCISE PROGRAM:  Access Code: [email or web address] URL: [email or web address] Date: 09/17/2023 Prepared by: Denese Finn  Exercises - Seated Cervical Retraction  - 2 x daily - 7 x weekly - 2 sets - 10 reps - Seated Upper Trapezius Stretch  - 2 x daily - 7 x weekly - 3 sets - 30sec hold - Seated Scapular Retraction  - 2 x daily - 7 x weekly - 2 sets - 10 reps - 3sec hold - Seated Cervical Rotation AROM  - 2 x daily - 7 x weekly - 2 sets - 10 reps - Standing Shoulder Row with Anchored Resistance  - 1 x daily - 5 x weekly - 2 sets - 10 reps - 2sec hold - Shoulder External Rotation and Scapular Retraction  - 2 x daily - 5 x weekly - 2 sets - 10 reps - 2sec  hold   ASSESSMENT:  CLINICAL IMPRESSION: Patient exhibits improved postural awareness and modest improvement in objective ROM measures. She has been on cusp of functional ROM for cervical spine extension and bilat rotation; she is slightly shy of functional rotation today and 7 deg shy of functional extension. Given minimal functional deficits at this time, pt may continue with ROM and flexibility work with home-based HEP and continue with regular participation in walking regimen and occupational education. Pt does not have pain reproduction with transfers/bed mobility at this time. Pt reports feeling well at this time and her caregiver reports no recent complaints or concerns with upper quarter pain. Given no notable pain complaints and no functional deficits relative to her prior level, pt is appropriate for continued home-based program.   OBJECTIVE IMPAIRMENTS: decreased ROM, decreased strength, hypomobility,  impaired flexibility, impaired UE functional use, postural dysfunction, and pain.   ACTIVITY LIMITATIONS: sleeping, transfers, bed mobility, and reach over head  PARTICIPATION LIMITATIONS: cleaning, community activity, and participation in occupational group home activities  PERSONAL FACTORS: Age, Time since onset of injury/illness/exacerbation, and 3+ comorbidities: (cerebral palsy, HTN, peripheral nerve disease, HLD, Type 2 DM, moderate intellectual disability) are also affecting patient's functional outcome.   REHAB POTENTIAL: Fair given comorbidities and CP/intellectual disability  CLINICAL DECISION MAKING: Evolving/moderate complexity  EVALUATION COMPLEXITY: Moderate   GOALS: Goals reviewed with patient? Yes  SHORT TERM GOALS: Target date: 09/05/2023  Pt will be independent with HEP to improve strength and decrease neck pain to improve pain-free function at home and work. Baseline: 08/08/23: Baseline HEP initiated and reviewed; MedBridge handout provided.    09/17/23: Per pt and  caregiver, pt is keeping up with HEP and walking program each day.  Goal status: ACHIEVED    LONG TERM GOALS: Target date: 09/26/2023  Patient will have cervical spine rotation to 60 deg in either direction and extension to 30 deg or greater without reproduction of pain indicative of sufficient ROM required for ADLs and self-care activities Baseline: 08/08/23: Mild limitation with R/L rotation and extension 18 deg     09/17/23: Rotation R 55, L 53; Extension 23 (on cusp of functional ROM at previous visits) Goal status: IN PROGRESS  2.  Pt will independently demonstrate plumb line posture with external acoustic meatus no more than 2 cm anterior to ACJ during sitting/standing periscapular exercises indicative of improved postural awareness and less likelihood of recurrent neck pain episodes. Baseline: 08/08/23: Pt rests in significant C-spine protraction and kyphotic posture at rest, postural changes are largely flexible.     09/17/23: Pt demonstrates upright sitting position when prompted with external acoustic meatus no more than 2 cm anterior to ACJ Goal status: ACHIEVED  3.  Pt will perform bed transfers/bed mobility (rolling, supine to sit/sitting to supine) without increase in neck pain as needed for independent self-care and improved sleep quality Baseline: 08/08/23: Notable pain with getting up from her bed in L paracervical/upper quarter region.     09/17/23: Pt able to to perform transfers without pain reproduction Goal status: ACHIEVED    PLAN: PT FREQUENCY: -  PT DURATION: -  PLANNED INTERVENTIONS: Therapeutic exercises, Therapeutic activity, Neuromuscular re-education, Balance training, Gait training, Patient/Family education, Self Care, Joint mobilization, Joint manipulation, Vestibular training, Canalith repositioning, Orthotic/Fit training, DME instructions, Dry Needling, Electrical stimulation, Spinal manipulation, Spinal mobilization, Cryotherapy, Moist heat, Taping, Traction,  Ultrasound, Ionotophoresis 4mg /ml Dexamethasone, Manual therapy, and Re-evaluation.  PLAN FOR NEXT SESSION: Pt to continue with HEP for cervical spine mobility/flexibility and postural exercises. Pt may f/u with MD or PT if condition regresses or suboptimal progress is made in home setting.     Denese Finn, PT, DPT #[id number]  Aleatha Hunting, PT 09/17/2023, 8:44 AM

## 2023-09-24 ENCOUNTER — Encounter: Admitting: Physical Therapy

## 2023-09-30 ENCOUNTER — Encounter: Payer: Self-pay | Admitting: Podiatry

## 2023-09-30 ENCOUNTER — Ambulatory Visit (INDEPENDENT_AMBULATORY_CARE_PROVIDER_SITE_OTHER): Admitting: Podiatry

## 2023-09-30 DIAGNOSIS — L84 Corns and callosities: Secondary | ICD-10-CM | POA: Diagnosis not present

## 2023-09-30 DIAGNOSIS — M205X2 Other deformities of toe(s) (acquired), left foot: Secondary | ICD-10-CM | POA: Diagnosis not present

## 2023-09-30 DIAGNOSIS — E119 Type 2 diabetes mellitus without complications: Secondary | ICD-10-CM | POA: Diagnosis not present

## 2023-09-30 DIAGNOSIS — B351 Tinea unguium: Secondary | ICD-10-CM

## 2023-09-30 DIAGNOSIS — E1142 Type 2 diabetes mellitus with diabetic polyneuropathy: Secondary | ICD-10-CM | POA: Diagnosis not present

## 2023-09-30 DIAGNOSIS — M79675 Pain in left toe(s): Secondary | ICD-10-CM | POA: Diagnosis not present

## 2023-09-30 DIAGNOSIS — M205X1 Other deformities of toe(s) (acquired), right foot: Secondary | ICD-10-CM

## 2023-09-30 DIAGNOSIS — M79674 Pain in right toe(s): Secondary | ICD-10-CM | POA: Diagnosis not present

## 2023-09-30 NOTE — Progress Notes (Signed)
 ANNUAL DIABETIC FOOT EXAM  Subjective: Rachel [name] presents today for annual diabetic foot exam. She is accompanied by group home caregiver. Patient states she starts day camp in July, and she is excited. She voices no new pedal concerns on today's visit. Chief Complaint  Patient presents with   [facility]    Rm1 [facility] diabetic/ Dr.Johnson Last visit Aug 21 2023/ A1c 8   Patient confirms h/o diabetes.  Patient denies any h/o foot wounds.  Patient has been diagnosed with neuropathy.  Rachel Norleen BIRCH, MD is patient's PCP.  Past Medical History:  Diagnosis Date   Cerebral palsy (HCC)    Diabetes mellitus without complication (HCC)    Hypercholesteremia    Hypertension    Patient Active Problem List   Diagnosis Date Noted   Porokeratosis 05/17/2022   Lower extremity edema 08/16/2020   Pain due to onychomycosis of toenails of both feet 02/02/2019   Acute gastroenteritis 01/26/2018   Diabetic polyneuropathy associated with type 2 diabetes mellitus (HCC) 09/12/2015   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   Aggression 07/13/2015   Palpitations 07/06/2015   Essential hypertension, benign 07/06/2015   Obesity 07/06/2015   Cerebral palsy (HCC) 12/24/2013   Controlled type 2 diabetes mellitus without complication (HCC) 12/24/2013   HLD (hyperlipidemia) 12/24/2013   BP (high blood pressure) 12/24/2013   Adiposity 12/24/2013   Peripheral nerve disease 12/24/2013   Past Surgical History:  Procedure Laterality Date   DILATION AND CURETTAGE OF UTERUS     Current Outpatient Medications on File Prior to Visit  Medication Sig Dispense Refill   acetaminophen  (TYLENOL ) 325 MG tablet Take 325 mg by mouth every 6 (six) hours as needed.     Alcohol Swabs (ALCOHOL PREP) 70 % PADS Apply 1 each topically 3 (three) times daily.     amoxicillin  (AMOXIL ) 500 MG capsule Take 500 mg by mouth 3 (three) times daily.     ARIPiprazole  (ABILIFY ) 20 MG tablet Take 20 mg by mouth daily.       benzonatate (TESSALON) 100 MG capsule Take 100 mg by mouth 2 (two) times daily as needed for cough.     camphor-menthol (SARNA) lotion Apply 1 Application topically as needed for itching. ANTI-ITCH CREAM     carbamazepine  (TEGRETOL ) 200 MG tablet Take 200 mg by mouth 2 (two) times daily.     chlorhexidine (PERIDEX) 0.12 % solution Use as directed 5 mLs in the mouth or throat daily.     diltiazem (CARDIZEM CD) 360 MG 24 hr capsule Take 360 mg by mouth daily.     EMBECTA AUTOSHIELD DUO 30G X 5 MM MISC      famotidine  (PEPCID ) 20 MG tablet Take 1 tablet by mouth 2 (two) times daily.     fluconazole  (DIFLUCAN ) 150 MG tablet Take 150 mg by mouth every 3 (three) days.     fluticasone  (FLONASE ) 50 MCG/ACT nasal spray Place 1 spray into both nostrils daily.     furosemide (LASIX) 20 MG tablet Take 1 tablet by mouth daily.     hydrALAZINE  (APRESOLINE ) 100 MG tablet Take 1 tablet by mouth 2 (two) times daily.     hydrochlorothiazide (HYDRODIURIL) 25 MG tablet Take 1 tablet by mouth daily.     ibuprofen  (ADVIL ) 800 MG tablet Take 800 mg by mouth every 6 (six) hours as needed.     Insulin  Glargine (LANTUS  SOLOSTAR) 100 UNIT/ML Solostar Pen Inject 20 Units into the skin at bedtime.     loratadine  (CLARITIN ) 10 MG  tablet Take 10 mg by mouth daily.      losartan  (COZAAR ) 100 MG tablet Take 1 tablet by mouth daily.     magnesium  oxide (MAG-OX) 400 MG tablet Take 400 mg by mouth daily.     metFORMIN (GLUCOPHAGE) 1000 MG tablet Take 1,000 mg by mouth 2 (two) times daily with a meal.      metoprolol tartrate (LOPRESSOR) 50 MG tablet Take 1 tablet by mouth 2 (two) times daily.     Multiple Vitamin (MULTIVITAMIN WITH MINERALS) TABS tablet Take 1 tablet by mouth daily.     naproxen (NAPROSYN) 500 MG tablet Take 1 tablet by mouth 2 (two) times daily as needed.     nystatin (MYCOSTATIN) 100000 UNIT/ML suspension Take 5 mLs by mouth 4 (four) times daily.     ofloxacin (OCUFLOX) 0.3 % ophthalmic solution       ondansetron  (ZOFRAN ) 4 MG tablet Take 1 tablet by mouth every 6 (six) hours as needed.     phenazopyridine  (PYRIDIUM ) 200 MG tablet Take 1 tablet (200 mg total) by mouth 3 (three) times daily. 6 tablet 0   pioglitazone (ACTOS) 15 MG tablet Take 15 mg by mouth daily.      polyethylene glycol (MIRALAX / GLYCOLAX) packet Take 17 g by mouth daily as needed for moderate constipation.     potassium chloride  SA (K-DUR,KLOR-CON ) 20 MEQ tablet Take 20 mEq by mouth 2 (two) times daily.      pravastatin  (PRAVACHOL ) 80 MG tablet Take 80 mg by mouth at bedtime.      prazosin  (MINIPRESS ) 2 MG capsule Take 1 tablet by mouth 2 (two) times daily.     senna-docusate (SENOKOT-S) 8.6-50 MG tablet Take 1 tablet by mouth 2 (two) times daily.     Sod Fluoride-Potassium Nitrate (FLUORIMAX 5000 SENSITIVE) 1.1-5 % GEL Place 1 Application onto teeth at bedtime.     traZODone  (DESYREL ) 50 MG tablet Take 50 mg by mouth at bedtime.     triamcinolone cream (KENALOG) 0.1 % Apply 1 Application topically 2 (two) times daily.     No current facility-administered medications on file prior to visit.    Allergies  Allergen Reactions   Ace Inhibitors Swelling   Social History   Occupational History   Not on file  Tobacco Use   Smoking status: Never   Smokeless tobacco: Never  Vaping Use   Vaping status: Never Used  Substance and Sexual Activity   Alcohol use: No    Alcohol/week: 0.0 standard drinks of alcohol   Drug use: No   Sexual activity: Not Currently   Family History  Problem Relation Age of Onset   Breast cancer Neg Hx     There is no immunization history on file for this patient.   Review of Systems: Negative except as noted in the HPI.   Objective: There were no vitals filed for this visit.  Rachel [name] is a pleasant 63 y.o. female in NAD. AAO X 3.  Diabetic foot exam was performed with the following findings:   Vascular Examination: Capillary refill time immediate b/l. Palpable pedal pulses.  Pedal hair present b/l. No pain with calf compression b/l. Skin temperature gradient WNL b/l. No cyanosis or clubbing b/l. No ischemia or gangrene noted b/l. No edema noted b/l LE.  Neurological Examination: Sensation grossly intact b/l with 10 gram monofilament. Vibratory sensation intact b/l. Pt has subjective symptoms of neuropathy.  Dermatological Examination: Pedal skin with normal turgor, texture and tone b/l.  No  open wounds. No interdigital macerations.   Toenails 2, 3 b/l thick, discolored, elongated with subungual debris and pain on dorsal palpation. Anonychia noted bilateral great toes, bilateral 4th toes, and bilateral 5th toes. Nailbed(s) epithelialized.   Hyperkeratotic lesion(s) posterior right heel, submet head 1 left foot and submet head 1 right foot.  No erythema, no edema, no drainage, no fluctuance.  Musculoskeletal Examination: Muscle strength 5/5 to all lower extremity muscle groups bilaterally. No pain, crepitus or joint limitation noted with ROM b/l LE. HAV with bunion b/l. Patient ambulates independently without assistive aids.  Radiographs: None     ADA Risk Categorization: Low Risk :  Patient has all of the following: Intact protective sensation No prior foot ulcer  No severe deformity Pedal pulses present  Assessment: 1. Pain due to onychomycosis of toenails of both feet   2. Callus   3. Acquired hallux limitus of both feet   4. Diabetic peripheral neuropathy associated with type 2 diabetes mellitus (HCC)   5. Encounter for diabetic foot exam [facility])      Plan: New Medicaid ABN signed today. Diabetic foot examination performed today. All patient's and/or POA's questions/concerns addressed on today's visit. Toenails bilateral 2nd toes and bilateral 3rd toes debrided in length and girth without incident. Calluses submet head 1 b/l and posterior right heel pared with sterile scalpel blade without incident. Monitor blood glucose per PCP/Endocrinologist's  recommendations.Continue soft, supportive shoe gear daily. Report any pedal injuries to medical professional. Call office if there are any questions/concerns. -Patient/POA to call should there be question/concern in the interim. Return in about 3 months (around 12/31/2023).  Delon LITTIE Merlin, DPM      [place] LOCATION: [address], [place] [address]                   Office [phone]   [facility] LOCATION: [address] [place], [place] [address] Office [phone]

## 2023-10-03 ENCOUNTER — Encounter: Payer: Self-pay | Admitting: Emergency Medicine

## 2023-10-03 ENCOUNTER — Ambulatory Visit
Admission: EM | Admit: 2023-10-03 | Discharge: 2023-10-03 | Disposition: A | Discharge status: Home / Self Care | Attending: Emergency Medicine | Admitting: Emergency Medicine

## 2023-10-03 DIAGNOSIS — K529 Noninfective gastroenteritis and colitis, unspecified: Secondary | ICD-10-CM | POA: Diagnosis not present

## 2023-10-03 LAB — URINALYSIS, W/ REFLEX TO CULTURE (INFECTION SUSPECTED)
Bacteria, UA: NONE SEEN
Bilirubin Urine: NEGATIVE
Glucose, UA: 500 mg/dL — AB
Hgb urine dipstick: NEGATIVE
Ketones, ur: NEGATIVE mg/dL
Leukocytes,Ua: NEGATIVE
Nitrite: NEGATIVE
Protein, ur: NEGATIVE mg/dL
Specific Gravity, Urine: 1.015 (ref 1.005–1.030)
pH: 7 (ref 5.0–8.0)

## 2023-10-03 LAB — GLUCOSE, CAPILLARY: Glucose-Capillary: 239 mg/dL — ABNORMAL HIGH (ref 70–99)

## 2023-10-03 MED ORDER — DICYCLOMINE HCL 20 MG PO TABS: 20.0000 mg | ORAL_TABLET | Freq: Two times a day (BID) | ORAL | 0 refills | Status: DC

## 2023-10-03 NOTE — ED Triage Notes (Addendum)
 Pt presents with vomiting x 2 after eating and headache that started today. Pt was given Zofran .

## 2023-10-03 NOTE — ED Provider Notes (Addendum)
 [facility]    CSN: [id number] Arrival date & time: 10/03/23  1634      History   Chief Complaint Chief Complaint  Patient presents with   Emesis   Headache    HPI Rachel [name] is a 63 y.o. female.   HPI  63 year old female with past medical history significant for diabetes, high cholesterol, hypertension, cerebral palsy presents for evaluation of episodes of vomiting after eating and a headache that started today.  She also reports that she has had some pain when she urinates and some urinary incontinence which is not typical for her.  She also Dors is some abdominal cramping.  She denies any fever, sore throat, diarrhea, or constipation.  No known sick contacts.  She is here with a worker from the group home.  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      Home Medications    Prior to Admission medications   Medication Sig Start Date End Date Taking? Authorizing Provider  dicyclomine (BENTYL) 20 MG tablet  Take 1 tablet (20 mg total) by mouth 2 (two) times daily. 10/03/23  Yes Bernardino Ditch, NP  acetaminophen  (TYLENOL ) 325 MG tablet Take 325 mg by mouth every 6 (six) hours as needed. 07/12/22   [provider]  Alcohol Swabs (ALCOHOL PREP) 70 % PADS Apply 1 each topically 3 (three) times daily. 06/01/23   [provider]  amoxicillin  (AMOXIL ) 500 MG capsule Take 500 mg by mouth 3 (three) times daily. 09/05/23   [provider]  ARIPiprazole  (ABILIFY ) 20 MG tablet Take 20 mg by mouth daily.     [provider]  benzonatate (TESSALON) 100 MG capsule Take 100 mg by mouth 2 (two) times daily as needed for cough. 07/12/22   [provider]  camphor-menthol VIKKI) lotion Apply 1 Application topically as needed for itching. ANTI-ITCH CREAM    [provider]  carbamazepine  (TEGRETOL ) 200 MG tablet Take 200 mg by mouth 2 (two) times daily.    [provider]  chlorhexidine (PERIDEX) 0.12 % solution Use as directed 5 mLs in the mouth or throat daily. 05/23/23   [provider]  diltiazem (CARDIZEM CD) 360 MG 24 hr capsule Take 360 mg by mouth daily. 10/23/22   [provider]  EMBECTA AUTOSHIELD DUO 30G X 5 MM MISC  09/22/23   [provider]  famotidine  (PEPCID ) 20 MG tablet Take 1 tablet by mouth 2 (two) times daily. 12/19/22   [provider]  fluconazole  (DIFLUCAN ) 150 MG  tablet Take 150 mg by mouth every 3 (three) days. 06/11/23   [provider]  fluticasone  (FLONASE ) 50 MCG/ACT nasal spray Place 1 spray into both nostrils daily.    [provider]  furosemide (LASIX) 20 MG tablet Take 1 tablet by mouth daily. 10/23/22   [provider]  hydrALAZINE  (APRESOLINE ) 100 MG tablet Take 1 tablet by mouth 2 (two) times daily. 10/23/22   [provider]  hydrochlorothiazide (HYDRODIURIL) 25 MG tablet Take 1 tablet by mouth daily. 05/22/22   [provider]  ibuprofen  (ADVIL ) 800 MG  tablet Take 800 mg by mouth every 6 (six) hours as needed.    [provider]  Insulin  Glargine (LANTUS  SOLOSTAR) 100 UNIT/ML Solostar Pen Inject 20 Units into the skin at bedtime.    [provider]  loratadine  (CLARITIN ) 10 MG tablet Take 10 mg by mouth daily.  09/21/14   [provider]  losartan  (COZAAR ) 100 MG tablet Take 1 tablet by mouth daily. 10/23/22   [provider]  magnesium  oxide (MAG-OX) 400 MG tablet Take 400 mg by mouth daily.    [provider]  metFORMIN (GLUCOPHAGE) 1000 MG tablet Take 1,000 mg by mouth 2 (two) times daily with a meal.  02/21/15   [provider]  metoprolol tartrate (LOPRESSOR) 50 MG tablet Take 1 tablet by mouth 2 (two) times daily. 12/19/22   [provider]  Multiple Vitamin (MULTIVITAMIN WITH MINERALS) TABS tablet Take 1 tablet by mouth daily.    [provider]  naproxen (NAPROSYN) 500 MG tablet Take 1 tablet by mouth 2 (two) times daily as needed. 07/12/22   [provider]  nystatin (MYCOSTATIN) 100000 UNIT/ML suspension Take 5 mLs by mouth 4 (four) times daily.    [provider]  ofloxacin (OCUFLOX) 0.3 % ophthalmic solution  07/15/23   [provider]  ondansetron  (ZOFRAN ) 4 MG tablet Take 1 tablet by mouth every 6 (six) hours as needed. 07/12/22   [provider]  phenazopyridine  (PYRIDIUM ) 200 MG tablet Take 1 tablet (200 mg total) by mouth 3 (three) times daily. 06/09/23   Bernardino Ditch, NP  pioglitazone (ACTOS) 15 MG tablet Take 15 mg by mouth daily.     [provider]  polyethylene glycol (MIRALAX / GLYCOLAX) packet Take 17 g by mouth daily as needed for moderate constipation.    [provider]  potassium chloride  SA (K-DUR,KLOR-CON ) 20 MEQ tablet Take 20 mEq by mouth 2 (two) times daily.  04/13/14   [provider]  pravastatin  (PRAVACHOL ) 80 MG tablet Take 80 mg by mouth at bedtime.     [provider]   prazosin  (MINIPRESS ) 2 MG capsule Take 1 tablet by mouth 2 (two) times daily. 10/23/22   [provider]  senna-docusate (SENOKOT-S) 8.6-50 MG tablet Take 1 tablet by mouth 2 (two) times daily.    [provider]  Sod Fluoride-Potassium Nitrate (FLUORIMAX 5000 SENSITIVE) 1.1-5 % GEL Place 1 Application onto teeth at bedtime.    [provider]  traZODone  (DESYREL ) 50 MG tablet Take 50 mg by mouth at bedtime.    [provider]  triamcinolone cream (KENALOG) 0.1 % Apply 1 Application topically 2 (two) times daily.    [provider]    Family History Family History  Problem Relation Age of Onset   Breast cancer Neg Hx     Social History Social History   Tobacco Use   Smoking status: Never   Smokeless tobacco:  Never  Vaping Use   Vaping status: Never Used  Substance Use Topics   Alcohol use: No    Alcohol/week: 0.0 standard drinks of alcohol   Drug use: No     Allergies   Ace inhibitors   Review of Systems Review of Systems  Constitutional:  Negative for fever.  Gastrointestinal:  Positive for abdominal pain, nausea and vomiting. Negative for constipation and diarrhea.  Genitourinary:  Positive for dysuria. Negative for frequency, hematuria and urgency.  Skin:  Negative for rash.  Neurological:  Positive for headaches.     Physical Exam Triage Vital Signs ED Triage Vitals  Encounter Vitals Group     BP      Girls Systolic BP Percentile      Girls Diastolic BP Percentile      Boys Systolic BP Percentile      Boys Diastolic BP Percentile      Pulse      Resp      Temp      Temp src      SpO2      Weight      Height      Head Circumference      Peak Flow      Pain Score      Pain Loc      Pain Education      Exclude from Growth Chart    No data found.  Updated Vital Signs BP (!) 173/77 (BP Location: Left Arm)   Pulse 60   Temp 99.2 F (37.3 C) (Oral)   Resp 16   SpO2 99%   Visual Acuity Right Eye  Distance:   Left Eye Distance:   Bilateral Distance:    Right Eye Near:   Left Eye Near:    Bilateral Near:     Physical Exam Vitals and nursing note reviewed.  Constitutional:      Appearance: Normal appearance. She is not ill-appearing.  HENT:     Head: Normocephalic and atraumatic.  Cardiovascular:     Rate and Rhythm: Normal rate and regular rhythm.     Pulses: Normal pulses.     Heart sounds: Normal heart sounds. No murmur heard.    No friction rub. No gallop.  Pulmonary:     Effort: Pulmonary effort is normal.     Breath sounds: Normal breath sounds. No wheezing, rhonchi or rales.  Abdominal:     General: Abdomen is flat.     Palpations: Abdomen is soft.     Tenderness: There is no abdominal tenderness. There is no right CVA tenderness, left CVA tenderness, guarding or rebound.  Skin:    General: Skin is warm and dry.     Capillary Refill: Capillary refill takes less than 2 seconds.     Findings: No rash.  Neurological:     General: No focal deficit present.     Mental Status: She is alert and oriented to person, place, and time.      UC Treatments / Results  Labs (all labs ordered are listed, but only abnormal results are displayed) Labs Reviewed  URINALYSIS, W/ REFLEX TO CULTURE (INFECTION SUSPECTED) - Abnormal; Notable for the following components:      Result Value   Glucose, UA 500 (*)    All other components within normal limits  GLUCOSE, CAPILLARY - Abnormal; Notable for the following components:   Glucose-Capillary 239 (*)    All other components within normal limits  CBG MONITORING, ED  EKG   Radiology No results found.  Procedures Procedures (including critical care time)  Medications Ordered in UC Medications - No data to display  Initial Impression / Assessment and Plan / UC Course  I have reviewed the triage vital signs and the nursing notes.  Pertinent labs & imaging results that were available during my care of the patient were  reviewed by me and considered in my medical decision making (see chart for details).   Patient is a pleasant, nontoxic-appearing 63 year old female presenting for evaluation of headache and vomiting as outlined in HPI above.  Here she has a mildly elevated temp of 99.2.  She reports that she has had 2 episodes of vomiting after eating and her nausea has responded to Zofran .  She also endorses a headache.  Additionally, she does endorse some abdominal cramping.  No diarrhea or constipation.  No known sick contacts.  When asked about elimination habits she reports that she has had some episodes of urinary incontinence and that it burns when she pees.  She reports that she recently has been holding her urine.  Her abdomen is soft and nontender.  No CVA tenderness on exam.  Because of her elevated temp, dysuria, and urinary incontinence I am concerned her symptoms are stemming from a urinary tract infection so I will order a urinalysis to assess for UTI.  Is also possible that she is experiencing a viral GI illness.  Urinalysis shows 500 glucose but is otherwise negative for evidence of infection.  Reflex microscopy is unremarkable.  I will order a fingerstick glucose.  Finger stick blood sugar is 239.  Patient reports that today for lunch she had baked fish, baked beans, broccoli, pudding, and sugar-free applesauce.  This most likely accounts for her elevated fingerstick glucose.  She has also not had her 5 PM dose of diabetic medication.  She did take her medication this morning.  With regards to the sugar in her urine, she was recently taken off of her glipizide.  I have advised staff to make an appointment to follow-up with her PCP to discuss possibly being put back on the glipizide for better glycemic control.  Otherwise, I feel the patient has a viral GI illness.  She has Zofran  at home and she can continue using it as needed for nausea.  I will send a prescription for Bentyl that she can use every 6  hours as needed for the abdominal cramping.  ER precautions reviewed.   Final Clinical Impressions(s) / UC Diagnoses   Final diagnoses:  Gastroenteritis     Discharge Instructions      Take the Zofran  every 8 hours as needed for nausea and vomiting.  They are an oral disintegrating tablet and you can place them on her under your tongue and then will be absorbed.  Use the Bentyl (dicyclomine) every 6 hours as needed for abdominal cramping.  Follow a clear liquid diet for the next 6 to 12 hours.  Clear liquids consist of broth, ginger ale, water, Pedialyte, and Jell-O.  After 6 to 12 hours, if you are tolerating clear liquids, you can advance to bland foods such as bananas, rice, applesauce, and toast.  If you tolerate bland foods you can continue to advance your diet as you see fit.  Because you are spilling sugar in your urine I would make an appointment with your primary care provider to discuss restarting your glipizide for better glycemic control.  If you develop a fever over 100.5, increased abdominal  pain, bloody vomit, or bloody stool return for reevaluation or go to the ER.      ED Prescriptions     Medication Sig Dispense Auth. Provider   dicyclomine (BENTYL) 20 MG tablet Take 1 tablet (20 mg total) by mouth 2 (two) times daily. 20 tablet Bernardino Ditch, NP      PDMP not reviewed this encounter.   Bernardino Ditch, NP 10/03/23 1744    Bernardino Ditch, NP 10/03/23 1745

## 2023-10-03 NOTE — Discharge Instructions (Addendum)
 Take the Zofran  every 8 hours as needed for nausea and vomiting.  They are an oral disintegrating tablet and you can place them on her under your tongue and then will be absorbed.  Use the Bentyl (dicyclomine) every 6 hours as needed for abdominal cramping.  Follow a clear liquid diet for the next 6 to 12 hours.  Clear liquids consist of broth, ginger ale, water, Pedialyte, and Jell-O.  After 6 to 12 hours, if you are tolerating clear liquids, you can advance to bland foods such as bananas, rice, applesauce, and toast.  If you tolerate bland foods you can continue to advance your diet as you see fit.  Because you are spilling sugar in your urine I would make an appointment with your primary care provider to discuss restarting your glipizide for better glycemic control.  If you develop a fever over 100.5, increased abdominal pain, bloody vomit, or bloody stool return for reevaluation or go to the ER.

## 2023-12-17 ENCOUNTER — Emergency Department

## 2023-12-17 ENCOUNTER — Emergency Department
Admission: EM | Admit: 2023-12-17 | Discharge: 2023-12-17 | Disposition: A | Discharge status: Home / Self Care | Attending: Emergency Medicine | Admitting: Emergency Medicine

## 2023-12-17 ENCOUNTER — Other Ambulatory Visit: Payer: Self-pay

## 2023-12-17 ENCOUNTER — Encounter: Payer: Self-pay | Admitting: *Deleted

## 2023-12-17 DIAGNOSIS — E876 Hypokalemia: Secondary | ICD-10-CM | POA: Diagnosis not present

## 2023-12-17 DIAGNOSIS — E119 Type 2 diabetes mellitus without complications: Secondary | ICD-10-CM | POA: Diagnosis not present

## 2023-12-17 DIAGNOSIS — R519 Headache, unspecified: Secondary | ICD-10-CM | POA: Diagnosis not present

## 2023-12-17 DIAGNOSIS — I10 Essential (primary) hypertension: Secondary | ICD-10-CM | POA: Diagnosis not present

## 2023-12-17 DIAGNOSIS — R030 Elevated blood-pressure reading, without diagnosis of hypertension: Secondary | ICD-10-CM

## 2023-12-17 LAB — BASIC METABOLIC PANEL WITH GFR
Anion gap: 13 (ref 5–15)
BUN: 9 mg/dL (ref 8–23)
CO2: 31 mmol/L (ref 22–32)
Calcium: 9.6 mg/dL (ref 8.9–10.3)
Chloride: 92 mmol/L — ABNORMAL LOW (ref 98–111)
Creatinine, Ser: 0.71 mg/dL (ref 0.44–1.00)
GFR, Estimated: >60 mL/min (ref >60)
Glucose, Bld: 267 mg/dL — ABNORMAL HIGH (ref 70–99)
Potassium: 3.3 mmol/L — ABNORMAL LOW (ref 3.5–5.1)
Sodium: 136 mmol/L (ref 135–145)

## 2023-12-17 LAB — URINALYSIS, ROUTINE W REFLEX MICROSCOPIC
Bacteria, UA: NONE SEEN
Bilirubin Urine: NEGATIVE
Glucose, UA: >=500 mg/dL — AB
Hgb urine dipstick: NEGATIVE
Ketones, ur: NEGATIVE mg/dL
Leukocytes,Ua: NEGATIVE
Nitrite: NEGATIVE
Protein, ur: 100 mg/dL — AB
Specific Gravity, Urine: 1.011 (ref 1.005–1.030)
Squamous Epithelial / HPF: 0 /HPF (ref 0–5)
pH: 8 (ref 5.0–8.0)

## 2023-12-17 LAB — TROPONIN I (HIGH SENSITIVITY)
Troponin I (High Sensitivity): 12 ng/L (ref <18)
Troponin I (High Sensitivity): 19 ng/L — ABNORMAL HIGH (ref <18)

## 2023-12-17 LAB — CBC
HCT: 37.6 % (ref 36.0–46.0)
Hemoglobin: 12.3 g/dL (ref 12.0–15.0)
MCH: 30.1 pg (ref 26.0–34.0)
MCHC: 32.7 g/dL (ref 30.0–36.0)
MCV: 91.9 fL (ref 80.0–100.0)
Platelets: 233 K/uL (ref 150–400)
RBC: 4.09 MIL/uL (ref 3.87–5.11)
RDW: 12 % (ref 11.5–15.5)
WBC: 7 K/uL (ref 4.0–10.5)
nRBC: 0 % (ref 0.0–0.2)

## 2023-12-17 MED ORDER — PROCHLORPERAZINE EDISYLATE 10 MG/2ML IJ SOLN
10.0000 mg | Freq: Once | INTRAMUSCULAR | Status: AC
  Administered 2023-12-17: 10 mg via INTRAVENOUS
  Filled 2023-12-17: qty 2

## 2023-12-17 MED ORDER — ACETAMINOPHEN 325 MG PO TABS
650.0000 mg | ORAL_TABLET | Freq: Once | ORAL | Status: AC
  Administered 2023-12-17: 650 mg via ORAL
  Filled 2023-12-17: qty 2

## 2023-12-17 MED ORDER — DIPHENHYDRAMINE HCL 50 MG/ML IJ SOLN
25.0000 mg | Freq: Once | INTRAMUSCULAR | Status: AC
  Administered 2023-12-17: 25 mg via INTRAVENOUS
  Filled 2023-12-17: qty 1

## 2023-12-17 MED ORDER — SODIUM CHLORIDE 0.9 % IV BOLUS
500.0000 mL | Freq: Once | INTRAVENOUS | Status: AC
  Administered 2023-12-17: 500 mL via INTRAVENOUS

## 2023-12-17 NOTE — ED Provider Notes (Signed)
 [facility] Provider Note    Event Date/Time   First MD Initiated Contact with Patient 12/17/23 1855     (approximate)   History   Hypertension and Headache   HPI  Rachel [name] is a 63 year old female HTN, T2DM presenting to the emergency department for evaluation of headache and elevated blood pressure.  Around 4 PM today, patient had onset of a headache over the front of her head.  Does report history of headaches but thinks this may have felt different.  Was noted to have elevated blood pressures.  No numbness, tingling, focal weakness.  Denies chest pain, shortness of breath.  Has history of hypertension, reports she has access to medications for this.     Physical Exam   Triage Vital Signs: ED Triage Vitals  Encounter Vitals Group     BP 12/17/23 1836 (!) 186/71     Girls Systolic BP Percentile --      Girls Diastolic BP Percentile --      Boys Systolic BP Percentile --      Boys Diastolic BP Percentile --      Pulse Rate 12/17/23 1836 60     Resp 12/17/23 1836 18     Temp 12/17/23 1836 98.6 F (37 C)     Temp Source 12/17/23 1836 Oral     SpO2 12/17/23 1836 98 %     Weight 12/17/23 1830 182 lb 15.7 oz (83 kg)     Height 12/17/23 1830 5' 7 (1.702 m)     Head Circumference --      Peak Flow --      Pain Score 12/17/23 1829 0     Pain Loc --      Pain Education --      Exclude from Growth Chart --     Most recent vital signs: Vitals:   12/17/23 1910 12/17/23 1930  BP:  (!) 167/64  Pulse: 62 62  Resp: 20 19  Temp:    SpO2: 97% 98%     General: Awake, interactive  CV:  Regular rate, good peripheral perfusion.  Resp:  Unlabored respirations Abd:  Nondistended Neuro:  Alert and oriented, normal extraocular movements, symmetric facial movement, sensation intact over bilateral upper and lower extremities with 5 out of 5 strength.  Normal finger-to-nose testing.   ED Results / Procedures / Treatments   Labs (all labs ordered  are listed, but only abnormal results are displayed) Labs Reviewed  BASIC METABOLIC PANEL WITH GFR - Abnormal; Notable for the following components:      Result Value   Potassium 3.3 (*)    Chloride 92 (*)    Glucose, Bld 267 (*)    All other components within normal limits  URINALYSIS, ROUTINE W REFLEX MICROSCOPIC - Abnormal; Notable for the following components:   Color, Urine STRAW (*)    APPearance CLEAR (*)    Glucose, UA >=500 (*)    Protein, ur 100 (*)    All other components within normal limits  TROPONIN I (HIGH SENSITIVITY) - Abnormal; Notable for the following components:   Troponin I (High Sensitivity) 19 (*)    All other components within normal limits  CBC  TROPONIN I (HIGH SENSITIVITY)     EKG EKG independently reviewed and interpreted by myself demonstrates:  EKG demonstrates sinus rhythm at a rate of 59, PR 164, QRS 107, QTc 427, no acute ST changes  RADIOLOGY Imaging independently reviewed and interpreted by myself demonstrates:  CT head without acute bleed  Formal Radiology Read:  CT Head Wo Contrast Result Date: 12/17/2023 CLINICAL DATA:  Increasing headaches EXAM: CT HEAD WITHOUT CONTRAST TECHNIQUE: Contiguous axial images were obtained from the base of the skull through the vertex without intravenous contrast. RADIATION DOSE REDUCTION: This exam was performed according to the departmental dose-optimization program which includes automated exposure control, adjustment of the mA and/or kV according to patient size and/or use of iterative reconstruction technique. COMPARISON:  05/18/2023 FINDINGS: Brain: No evidence of acute infarction, hemorrhage, hydrocephalus, extra-axial collection or mass lesion/mass effect. Vascular: No hyperdense vessel or unexpected calcification. Skull: Normal. Negative for fracture or focal lesion. Sinuses/Orbits: No acute finding. Other: None. IMPRESSION: No acute intracranial abnormality noted. Electronically Signed   By: Oneil Devonshire  M.D.   On: 12/17/2023 19:24    PROCEDURES:  Critical Care performed: No  Procedures   MEDICATIONS ORDERED IN ED: Medications  sodium chloride  0.9 % bolus 500 mL (500 mLs Intravenous New Bag/Given 12/17/23 1942)  acetaminophen  (TYLENOL ) tablet 650 mg (650 mg Oral Given 12/17/23 1942)  diphenhydrAMINE  (BENADRYL ) injection 25 mg (25 mg Intravenous Given 12/17/23 1942)  prochlorperazine  (COMPAZINE ) injection 10 mg (10 mg Intravenous Given 12/17/23 1943)     IMPRESSION / MDM / ASSESSMENT AND PLAN / ED COURSE  I reviewed the triage vital signs and the nursing notes.  Differential diagnosis includes, but is not limited to: suspect benign headache such as migraine, no fever, focal neuro deficits, change in character of headache to suggest SAH, meningitis, consideration for intracranial bleed/hypertensive emergency though lower suspicion based on presentation  Patient's presentation is most consistent with acute illness / injury with system symptoms.  Patient presents with headache with reassuring exam.  Hypertensive on presentation, improved at the time of my initial evaluation.  Labs with reassuring CBC, BMP with mild hyperglycemia without evidence of DKA.  Urinalysis without evidence of infection.  Negative initial troponin, repeat only minimally elevated.  Patient without cardiorespiratory symptoms, very low suspicion ACS.  CT head was ordered in the setting of her elevated blood pressure and acute onset headache which was reassuring.  Obtained under 6 hours from onset of headache, very low suspicion SAH.  Patient treated symptomatically with migraine cocktail.  On reevaluation, patient reports complete resolution of her symptoms.  Group home worker present he reports the patient does appear to be at her baseline.  They are both comfortable discharge home.  Strict return precautions provided.  Patient discharged in stable condition.      FINAL CLINICAL IMPRESSION(S) / ED DIAGNOSES   Final  diagnoses:  Acute nonintractable headache, unspecified headache type  Elevated blood pressure reading     Rx / DC Orders   ED Discharge Orders     None        Note:  This document was prepared using Dragon voice recognition software and may include unintentional dictation errors.   Levander Slate, MD 12/17/23 [phone]

## 2023-12-17 NOTE — ED Notes (Signed)
 Pt to ct scan.

## 2023-12-17 NOTE — ED Notes (Signed)
 Pt up to bathroom with assistance   grouup home care giver with pt.   Pt alert.  Iv in place   meds given.

## 2023-12-17 NOTE — Discharge Instructions (Signed)
 You were seen in the ER today for evaluation of your headache. Your testing here was fortunately reassuring. Please arrange follow-up with a primary care doctor for reevaluation if your symptoms. Return to the ER if you develop new or worsening headache, fever, neck stiffness, changes in vision, difficulty walking, weakness, dizziness, confusion, vomiting, numbness, tingling, or any other new or concerning symptoms that you believe warrants immediate attention.   Please make sure you are taking your blood pressure medication as directed.

## 2023-12-17 NOTE — ED Triage Notes (Signed)
 Pt brought in via ems from  [facility]   pt has high blood pressure and a headache.  Sx began at 1600 approx.  Pt reported nausea.  Zofran  4 mg iv given by ems.  Pt alert  iv in place on arrival to er.

## 2024-01-03 ENCOUNTER — Ambulatory Visit (INDEPENDENT_AMBULATORY_CARE_PROVIDER_SITE_OTHER): Admitting: Podiatry

## 2024-01-03 DIAGNOSIS — M79674 Pain in right toe(s): Secondary | ICD-10-CM

## 2024-01-03 DIAGNOSIS — L84 Corns and callosities: Secondary | ICD-10-CM

## 2024-01-03 DIAGNOSIS — B351 Tinea unguium: Secondary | ICD-10-CM | POA: Diagnosis not present

## 2024-01-03 DIAGNOSIS — E1142 Type 2 diabetes mellitus with diabetic polyneuropathy: Secondary | ICD-10-CM

## 2024-01-03 DIAGNOSIS — M79675 Pain in left toe(s): Secondary | ICD-10-CM | POA: Diagnosis not present

## 2024-01-06 ENCOUNTER — Encounter: Payer: Self-pay | Admitting: Podiatry

## 2024-01-06 NOTE — Progress Notes (Signed)
Subjective:  Patient ID: Rachel [name], female    DOB: 1960/12/12,  MRN: [id number]  Juleah Paradise presents to clinic today for at risk foot care with history of diabetic neuropathy and callus(es) right foot and painful mycotic toenails that are difficult to trim. Painful toenails interfere with ambulation. Aggravating factors include wearing enclosed shoe gear. Pain is relieved with periodic professional debridement. Painful calluses are aggravated when weightbearing with and without shoegear. Pain is relieved with periodic professional debridement. She is accompanied by her caregiver from her group home this morning. Chief Complaint  Patient presents with   Toe Pain    Diabetic foot care. A1c is 6.3.  Last visit with Dr. Rudolpho at [facility] was Sept. 19,2025.   New problem(s): None.   PCP is [name], MD.  Allergies  Allergen Reactions   Ace Inhibitors Swelling    Review of Systems: Negative except as noted in the HPI.  Objective: No changes noted in today's physical examination. There were no vitals filed for this visit. Tomeika Weinmann is a pleasant 63 y.o. female in NAD. AAO x 3.  Vascular Examination: Capillary refill time immediate b/l. Vascular status intact b/l with palpable pedal pulses. Pedal hair diminished b/l. No pain with calf compression b/l. Skin temperature gradient WNL b/l. No cyanosis or clubbing b/l. No ischemia or gangrene noted b/l. No edema noted b/l LE.  Neurological Examination: Sensation grossly intact b/l with 10 gram monofilament. Vibratory sensation intact b/l. Pt has subjective symptoms of neuropathy.  Dermatological Examination: Pedal skin with normal turgor, texture and tone b/l.  No open wounds. No interdigital macerations.   Toenails 2, 3, 5 b/l thick, discolored, elongated with subungual debris and pain on dorsal palpation.   Hyperkeratotic lesion(s) submet head 1 right foot.  No erythema, no edema, no drainage, no  fluctuance.  Musculoskeletal Examination: Muscle strength 5/5 to all lower extremity muscle groups bilaterally. HAV with bunion deformity noted b/l LE.SABRA No pain, crepitus or joint limitation noted with ROM b/l LE.  Patient ambulates independently without assistive aids.  Radiographs: None  Assessment/Plan: 1. Pain due to onychomycosis of toenails of both feet   2. Callus   3. Diabetic peripheral neuropathy associated with type 2 diabetes mellitus (HCC)     -Medicaid ABN signed for this year. Patient consents for services of debridement of mycotic toenails and callus right foot today. Copy has been placed in patient chart. -Continue foot and shoe inspections daily. Monitor blood glucose per PCP/Endocrinologist's recommendations. -Patient to continue soft, supportive shoe gear daily. -Toenails bilateral 2nd toes, bilateral 3rd toes, and bilateral 5th toes debrided in length and girth without iatrogenic bleeding with sterile nail nipper and dremel.  -Callus(es) submet head 1 right foot pared utilizing sterile scalpel blade without complication or incident. Total number debrided =1. -Patient/POA to call should there be question/concern in the interim.   Return in about 3 months (around 04/04/2024).  Delon LITTIE Merlin, DPM      [facility] LOCATION: [address] [place] [address]                   Office [phone]   [facility] LOCATION: [address] [place], [place] [address] Office [phone]  [phone]  

## 2024-02-06 ENCOUNTER — Ambulatory Visit
Admission: RE | Admit: 2024-02-06 | Discharge: 2024-02-06 | Disposition: A | Discharge status: Home / Self Care | Source: Ambulatory Visit | Attending: Emergency Medicine | Admitting: Emergency Medicine

## 2024-02-06 VITALS — BP 174/84 | HR 57 | Temp 97.8°F | Resp 16

## 2024-02-06 DIAGNOSIS — R300 Dysuria: Secondary | ICD-10-CM | POA: Diagnosis present

## 2024-02-06 LAB — POCT URINE DIPSTICK
Bilirubin, UA: NEGATIVE
Blood, UA: NEGATIVE
Glucose, UA: NEGATIVE mg/dL
Ketones, POC UA: NEGATIVE mg/dL
Leukocytes, UA: NEGATIVE
Nitrite, UA: NEGATIVE
Protein Ur, POC: NEGATIVE mg/dL
Spec Grav, UA: 1.01 (ref 1.010–1.025)
Urobilinogen, UA: 0.2 U/dL
pH, UA: 6.5 (ref 5.0–8.0)

## 2024-02-06 MED ORDER — FLUCONAZOLE 150 MG PO TABS: 150.0000 mg | ORAL_TABLET | ORAL | 0 refills | Status: AC

## 2024-02-06 NOTE — ED Provider Notes (Addendum)
 [facility]    CSN: [id number] Arrival date & time: 02/06/24  9060      History   Chief Complaint Chief Complaint  Patient presents with   Urinary Frequency    Possible UTI - Entered by patient    HPI Latanja Lehenbauer is a 63 y.o. female.   HPI  63 year old female with past medical history significant for hypertension, high cholesterol, diabetes, and cerebral palsy presents for evaluation of UTI symptoms that started 3 to 4 days ago.  She is endorsing dysuria, urgency, frequency, and suprapubic pain.  No fever.  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      Home Medications    Prior to Admission medications   Medication Sig Start Date End Date Taking? Authorizing Provider  ARIPiprazole  (ABILIFY ) 20 MG tablet Take 20 mg by mouth daily.    Yes [provider]  diltiazem (CARDIZEM CD) 360 MG 24 hr capsule Take 360 mg by mouth daily. 10/23/22  Yes [provider]  fluconazole  (DIFLUCAN ) 150 MG  tablet Take 1 tablet (150 mg total) by mouth every 3 (three) days for 3 doses. 02/06/24 02/13/24 Yes Bernardino Ditch, NP  fluticasone  (FLONASE ) 50 MCG/ACT nasal spray Place 1 spray into both nostrils daily.   Yes [provider]  hydrALAZINE  (APRESOLINE ) 100 MG tablet Take 1 tablet by mouth 2 (two) times daily. 10/23/22  Yes [provider]  hydrochlorothiazide (HYDRODIURIL) 25 MG tablet Take 1 tablet by mouth daily. 05/22/22  Yes [provider]  loratadine  (CLARITIN ) 10 MG tablet Take 10 mg by mouth daily.  09/21/14  Yes [provider]  losartan  (COZAAR ) 100 MG tablet Take 1 tablet by mouth daily. 10/23/22  Yes [provider]  metFORMIN (GLUCOPHAGE) 1000 MG tablet Take 1,000 mg by mouth 2 (two) times daily with a meal.  02/21/15  Yes [provider]  metoprolol tartrate (LOPRESSOR) 50 MG tablet Take 1 tablet by mouth 2 (two) times daily. 12/19/22  Yes [provider]  Multiple Vitamin (MULTIVITAMIN WITH MINERALS) TABS tablet Take 1 tablet by mouth daily.   Yes [provider]  pioglitazone (ACTOS) 15 MG tablet Take 15 mg by mouth daily.    Yes [provider]  potassium chloride  SA (K-DUR,KLOR-CON ) 20 MEQ tablet Take 20 mEq by mouth 2 (two) times daily.  04/13/14  Yes [provider]  pravastatin  (PRAVACHOL ) 80 MG tablet Take 80 mg by mouth at bedtime.    Yes [provider]  prazosin  (MINIPRESS ) 2 MG capsule  Take 1 tablet by mouth 2 (two) times daily. 10/23/22  Yes [provider]  traZODone  (DESYREL ) 50 MG tablet Take 50 mg by mouth at bedtime.   Yes [provider]  acetaminophen  (TYLENOL ) 325 MG tablet Take 325 mg by mouth every 6 (six) hours as needed. 07/12/22   [provider]  Alcohol Swabs (ALCOHOL PREP) 70 % PADS Apply 1 each topically 3 (three) times daily. 06/01/23   [provider]  camphor-menthol VIKKI) lotion Apply 1 Application topically as needed for itching.  ANTI-ITCH CREAM    [provider]  carbamazepine  (TEGRETOL ) 200 MG tablet Take 200 mg by mouth 2 (two) times daily.    [provider]  chlorhexidine (PERIDEX) 0.12 % solution Use as directed 5 mLs in the mouth or throat daily. 05/23/23   [provider]  EMBECTA AUTOSHIELD DUO 30G X 5 MM MISC  09/22/23   [provider]  famotidine  (PEPCID ) 20 MG tablet Take 1 tablet by mouth 2 (two) times daily. 12/19/22   [provider]  furosemide (LASIX) 20 MG tablet Take 1 tablet by mouth daily. 10/23/22   [provider]  ibuprofen  (ADVIL ) 800 MG tablet Take 800 mg by mouth every 6 (six) hours as needed.    [provider]  Insulin  Glargine (LANTUS  SOLOSTAR) 100 UNIT/ML Solostar Pen Inject 20 Units into the skin at bedtime.    [provider]  magnesium  oxide (MAG-OX) 400 MG tablet Take 400 mg by mouth daily.    [provider]  naproxen (NAPROSYN) 500 MG tablet Take 1 tablet by mouth 2 (two) times daily as needed. 07/12/22   [provider]  nystatin (MYCOSTATIN) 100000 UNIT/ML suspension Take 5 mLs by mouth 4 (four) times daily.    [provider]  ofloxacin (OCUFLOX) 0.3 % ophthalmic solution  07/15/23   [provider]  ondansetron  (ZOFRAN ) 4 MG tablet Take 1 tablet by mouth every 6 (six) hours as needed. 07/12/22   [provider]  phenazopyridine  (PYRIDIUM ) 200 MG tablet Take 1 tablet (200 mg total) by mouth 3 (three) times daily. 06/09/23   Bernardino Ditch, NP  polyethylene glycol (MIRALAX / GLYCOLAX) packet Take 17 g by mouth daily as needed for moderate constipation.    [provider]  senna-docusate (SENOKOT-S) 8.6-50 MG tablet Take 1 tablet by mouth 2 (two) times daily.    [provider]  Sod Fluoride-Potassium Nitrate (FLUORIMAX 5000 SENSITIVE) 1.1-5 % GEL Place 1 Application onto teeth at bedtime.    [provider]  triamcinolone cream (KENALOG) 0.1 % Apply 1  Application topically 2 (two) times daily.    [provider]    Family History Family History  Problem Relation Age of Onset   Breast cancer Neg Hx     Social History Social History   Tobacco Use   Smoking status: Never   Smokeless tobacco: Never  Vaping Use   Vaping status: Never Used  Substance Use Topics   Alcohol use: No    Alcohol/week: 0.0 standard drinks of alcohol   Drug use: No     Allergies   Ace inhibitors   Review of Systems Review of Systems  Constitutional:  Negative for fever.  Gastrointestinal:  Positive for abdominal pain. Negative for nausea and vomiting.  Genitourinary:  Positive for dysuria, frequency, urgency and vaginal discharge. Negative for hematuria and vaginal pain.     Physical Exam Triage Vital Signs ED Triage Vitals  Encounter Vitals Group  BP      Girls Systolic BP Percentile      Girls Diastolic BP Percentile      Boys Systolic BP Percentile      Boys Diastolic BP Percentile      Pulse      Resp      Temp      Temp src      SpO2      Weight      Height      Head Circumference      Peak Flow      Pain Score      Pain Loc      Pain Education      Exclude from Growth Chart    No data found.  Updated Vital Signs BP (!) 174/84 (BP Location: Left Arm)   Pulse (!) 57   Temp 97.8 F (36.6 C) (Oral)   Resp 16   SpO2 96%   Visual Acuity Right Eye Distance:   Left Eye Distance:   Bilateral Distance:    Right Eye Near:   Left Eye Near:    Bilateral Near:     Physical Exam Vitals and nursing note reviewed. Chaperone present: Delon Lunger, RN.  Constitutional:      Appearance: Normal appearance. She is not ill-appearing.  HENT:     Head: Normocephalic and atraumatic.  Cardiovascular:     Rate and Rhythm: Normal rate and regular rhythm.     Pulses: Normal pulses.     Heart sounds: Normal heart sounds. No murmur heard.    No friction rub. No gallop.  Pulmonary:     Effort: Pulmonary effort is  normal.     Breath sounds: Normal breath sounds. No wheezing, rhonchi or rales.  Abdominal:     General: Abdomen is flat.     Palpations: Abdomen is soft.     Tenderness: There is abdominal tenderness. There is no right CVA tenderness, left CVA tenderness, guarding or rebound.     Comments: Mild suprapubic tenderness.  No guarding or rebound.  Skin:    General: Skin is warm and dry.     Capillary Refill: Capillary refill takes less than 2 seconds.     Findings: No rash.  Neurological:     General: No focal deficit present.     Mental Status: She is alert and oriented to person, place, and time.      UC Treatments / Results  Labs (all labs ordered are listed, but only abnormal results are displayed) Labs Reviewed  POCT URINE DIPSTICK - Normal  CERVICOVAGINAL ANCILLARY ONLY    EKG   Radiology No results found.  Procedures Procedures (including critical care time)  Medications Ordered in UC Medications - No data to display  Initial Impression / Assessment and Plan / UC Course  I have reviewed the triage vital signs and the nursing notes.  Pertinent labs & imaging results that were available during my care of the patient were reviewed by me and considered in my medical decision making (see chart for details).   Patient is a pleasant 63 year old female presenting for evaluation of UTI symptoms that been going on for the last 3 to 4 days.  She does have a history of UTIs as well as vaginal yeast infection.  Given that she is a diabetic she has increased risk for yeast infection.  I will order urinalysis to assess for the presence of UTI.  Urinalysis does not show any evidence of infection.  With Delon Lunger, RN, as chaperone I did perform a inspection of the patient's cervical vulva and did collect a cytology swab to assess the presence of BV or yeast.  I did not visualize any discharge though her labia minora are markedly erythematous.  Given that she has history of  yeast infections I will treat her presumptively with Diflucan  pending the results of the cytology swab.  I will have her take 1 Diflucan  tablet now and repeat dosing every 3 days for total of 3 doses.  Cytology swab negative for both BV or yeast.  Final Clinical Impressions(s) / UC Diagnoses   Final diagnoses:  Dysuria     Discharge Instructions      Your urinalysis did not show any evidence of infection.  You do have significant redness to your labia minora which suggest possible yeast infection.  Given that you are a diabetic you are at increased risk for yeast infection.  We have collected a swab which we will send to the lab to assess for the presence of yeast or possible bacterial vaginosis which can cause urinary symptoms.  We are treating you presumptively for yeast infection due to the fact that you have had in the past.  Please take 1 Diflucan  tablet now and repeat dosing every 3 days for total of 3 doses.  If you develop any new or worsening symptoms please return for reevaluation or follow-up with your primary care provider.     ED Prescriptions     Medication Sig Dispense Auth. Provider   fluconazole  (DIFLUCAN ) 150 MG tablet Take 1 tablet (150 mg total) by mouth every 3 (three) days for 3 doses. 3 tablet Bernardino Ditch, NP      PDMP not reviewed this encounter.   Bernardino Ditch, NP 02/06/24 1037    Bernardino Ditch, NP 02/08/24 [phone]

## 2024-02-06 NOTE — Discharge Instructions (Addendum)
 Your urinalysis did not show any evidence of infection.  You do have significant redness to your labia minora which suggest possible yeast infection.  Given that you are a diabetic you are at increased risk for yeast infection.  We have collected a swab which we will send to the lab to assess for the presence of yeast or possible bacterial vaginosis which can cause urinary symptoms.  We are treating you presumptively for yeast infection due to the fact that you have had in the past.  Please take 1 Diflucan  tablet now and repeat dosing every 3 days for total of 3 doses.  If you develop any new or worsening symptoms please return for reevaluation or follow-up with your primary care provider.

## 2024-02-06 NOTE — ED Triage Notes (Signed)
 Urinary frequency, and urgency, burning with urination, lower abdominal pain x 3-4 days.

## 2024-02-07 LAB — CERVICOVAGINAL ANCILLARY ONLY
Bacterial Vaginitis (gardnerella): NEGATIVE
Candida Glabrata: NEGATIVE
Candida Vaginitis: NEGATIVE
Comment: NEGATIVE
Comment: NEGATIVE
Comment: NEGATIVE

## 2024-02-18 ENCOUNTER — Other Ambulatory Visit: Payer: Self-pay | Admitting: Obstetrics and Gynecology

## 2024-02-18 DIAGNOSIS — Z1231 Encounter for screening mammogram for malignant neoplasm of breast: Secondary | ICD-10-CM

## 2024-02-20 ENCOUNTER — Other Ambulatory Visit: Payer: Self-pay

## 2024-02-20 ENCOUNTER — Emergency Department
Admission: EM | Admit: 2024-02-20 | Discharge: 2024-02-21 | Disposition: A | Discharge status: Home / Self Care | Source: Other Acute Inpatient Hospital | Attending: Emergency Medicine | Admitting: Emergency Medicine

## 2024-02-20 DIAGNOSIS — Z7984 Long term (current) use of oral hypoglycemic drugs: Secondary | ICD-10-CM | POA: Diagnosis not present

## 2024-02-20 DIAGNOSIS — G809 Cerebral palsy, unspecified: Secondary | ICD-10-CM | POA: Diagnosis not present

## 2024-02-20 DIAGNOSIS — R45851 Suicidal ideations: Secondary | ICD-10-CM | POA: Diagnosis not present

## 2024-02-20 DIAGNOSIS — Z794 Long term (current) use of insulin: Secondary | ICD-10-CM | POA: Diagnosis not present

## 2024-02-20 DIAGNOSIS — F71 Moderate intellectual disabilities: Secondary | ICD-10-CM | POA: Diagnosis not present

## 2024-02-20 DIAGNOSIS — F79 Unspecified intellectual disabilities: Secondary | ICD-10-CM | POA: Insufficient documentation

## 2024-02-20 DIAGNOSIS — R451 Restlessness and agitation: Secondary | ICD-10-CM | POA: Insufficient documentation

## 2024-02-20 DIAGNOSIS — E119 Type 2 diabetes mellitus without complications: Secondary | ICD-10-CM | POA: Diagnosis not present

## 2024-02-20 DIAGNOSIS — R4689 Other symptoms and signs involving appearance and behavior: Secondary | ICD-10-CM | POA: Diagnosis present

## 2024-02-20 DIAGNOSIS — I10 Essential (primary) hypertension: Secondary | ICD-10-CM | POA: Insufficient documentation

## 2024-02-20 LAB — COMPREHENSIVE METABOLIC PANEL WITH GFR
ALT: 16 U/L (ref 0–44)
AST: 22 U/L (ref 15–41)
Albumin: 4.6 g/dL (ref 3.5–5.0)
Alkaline Phosphatase: 129 U/L — ABNORMAL HIGH (ref 38–126)
Anion gap: 15 (ref 5–15)
BUN: 7 mg/dL — ABNORMAL LOW (ref 8–23)
CO2: 27 mmol/L (ref 22–32)
Calcium: 9.9 mg/dL (ref 8.9–10.3)
Chloride: 96 mmol/L — ABNORMAL LOW (ref 98–111)
Creatinine, Ser: 0.69 mg/dL (ref 0.44–1.00)
GFR, Estimated: >60 mL/min (ref >60)
Glucose, Bld: 210 mg/dL — ABNORMAL HIGH (ref 70–99)
Potassium: 3.3 mmol/L — ABNORMAL LOW (ref 3.5–5.1)
Sodium: 138 mmol/L (ref 135–145)
Total Bilirubin: 0.4 mg/dL (ref 0.0–1.2)
Total Protein: 8.6 g/dL — ABNORMAL HIGH (ref 6.5–8.1)

## 2024-02-20 LAB — URINE DRUG SCREEN
Amphetamines: NEGATIVE
Barbiturates: NEGATIVE
Benzodiazepines: NEGATIVE
Cocaine: NEGATIVE
Fentanyl: NEGATIVE
Methadone Scn, Ur: NEGATIVE
Opiates: NEGATIVE
Tetrahydrocannabinol: NEGATIVE

## 2024-02-20 LAB — URINALYSIS, W/ REFLEX TO CULTURE (INFECTION SUSPECTED)
Bacteria, UA: NONE SEEN
Bilirubin Urine: NEGATIVE
Glucose, UA: 50 mg/dL — AB
Hgb urine dipstick: NEGATIVE
Ketones, ur: NEGATIVE mg/dL
Leukocytes,Ua: NEGATIVE
Nitrite: NEGATIVE
Protein, ur: NEGATIVE mg/dL
Specific Gravity, Urine: 1.003 — ABNORMAL LOW (ref 1.005–1.030)
Squamous Epithelial / HPF: 0 /HPF (ref 0–5)
WBC, UA: 0 WBC/hpf (ref 0–5)
pH: 8 (ref 5.0–8.0)

## 2024-02-20 LAB — CBC
HCT: 39.3 % (ref 36.0–46.0)
Hemoglobin: 12.9 g/dL (ref 12.0–15.0)
MCH: 30.1 pg (ref 26.0–34.0)
MCHC: 32.8 g/dL (ref 30.0–36.0)
MCV: 91.8 fL (ref 80.0–100.0)
Platelets: 238 K/uL (ref 150–400)
RBC: 4.28 MIL/uL (ref 3.87–5.11)
RDW: 11.9 % (ref 11.5–15.5)
WBC: 6.6 K/uL (ref 4.0–10.5)
nRBC: 0 % (ref 0.0–0.2)

## 2024-02-20 LAB — CBG MONITORING, ED: Glucose-Capillary: 167 mg/dL — ABNORMAL HIGH (ref 70–99)

## 2024-02-20 LAB — ACETAMINOPHEN LEVEL: Acetaminophen (Tylenol), Serum: <10 ug/mL — ABNORMAL LOW (ref 10–30)

## 2024-02-20 LAB — SALICYLATE LEVEL: Salicylate Lvl: <7 mg/dL — ABNORMAL LOW (ref 7.0–30.0)

## 2024-02-20 LAB — ETHANOL: Alcohol, Ethyl (B): <15 mg/dL (ref <15)

## 2024-02-20 MED ORDER — FLUTICASONE PROPIONATE 50 MCG/ACT NA SUSP
1.0000 | Freq: Every day | NASAL | Status: DC
  Filled 2024-02-20: qty 16

## 2024-02-20 MED ORDER — METFORMIN HCL 500 MG PO TABS
1000.0000 mg | ORAL_TABLET | Freq: Two times a day (BID) | ORAL | Status: DC
  Administered 2024-02-21: 1000 mg via ORAL
  Filled 2024-02-20: qty 2

## 2024-02-20 MED ORDER — LORATADINE 10 MG PO TABS: 10.0000 mg | ORAL_TABLET | Freq: Every day | ORAL | Status: DC

## 2024-02-20 MED ORDER — IBUPROFEN 600 MG PO TABS: 600.0000 mg | ORAL_TABLET | Freq: Three times a day (TID) | ORAL | Status: DC | PRN

## 2024-02-20 MED ORDER — HYDRALAZINE HCL 50 MG PO TABS
100.0000 mg | ORAL_TABLET | Freq: Two times a day (BID) | ORAL | Status: DC
  Administered 2024-02-20 – 2024-02-21 (×2): 100 mg via ORAL
  Filled 2024-02-20 (×2): qty 2

## 2024-02-20 MED ORDER — FAMOTIDINE 20 MG PO TABS
20.0000 mg | ORAL_TABLET | Freq: Two times a day (BID) | ORAL | Status: DC
  Administered 2024-02-20 – 2024-02-21 (×2): 20 mg via ORAL
  Filled 2024-02-20 (×2): qty 1

## 2024-02-20 MED ORDER — ALUM & MAG HYDROXIDE-SIMETH 200-200-20 MG/5ML PO SUSP: 30.0000 mL | Freq: Four times a day (QID) | ORAL | Status: DC | PRN

## 2024-02-20 MED ORDER — CARBAMAZEPINE 200 MG PO TABS
200.0000 mg | ORAL_TABLET | Freq: Two times a day (BID) | ORAL | Status: DC
  Administered 2024-02-20 – 2024-02-21 (×2): 200 mg via ORAL
  Filled 2024-02-20 (×2): qty 1

## 2024-02-20 MED ORDER — ARIPIPRAZOLE 10 MG PO TABS
20.0000 mg | ORAL_TABLET | Freq: Every day | ORAL | Status: DC
  Administered 2024-02-21: 20 mg via ORAL
  Filled 2024-02-20: qty 2

## 2024-02-20 MED ORDER — METOPROLOL TARTRATE 50 MG PO TABS
100.0000 mg | ORAL_TABLET | Freq: Two times a day (BID) | ORAL | Status: DC
  Administered 2024-02-20 – 2024-02-21 (×2): 100 mg via ORAL
  Filled 2024-02-20 (×2): qty 2

## 2024-02-20 MED ORDER — DILTIAZEM HCL ER COATED BEADS 180 MG PO CP24
360.0000 mg | ORAL_CAPSULE | Freq: Every day | ORAL | Status: DC
  Administered 2024-02-21: 360 mg via ORAL
  Filled 2024-02-20: qty 2

## 2024-02-20 MED ORDER — ONDANSETRON HCL 4 MG PO TABS: 4.0000 mg | ORAL_TABLET | Freq: Three times a day (TID) | ORAL | Status: DC | PRN

## 2024-02-20 MED ORDER — HYDROCHLOROTHIAZIDE 12.5 MG PO TABS
12.5000 mg | ORAL_TABLET | Freq: Every day | ORAL | Status: DC
  Administered 2024-02-21: 12.5 mg via ORAL
  Filled 2024-02-20: qty 1

## 2024-02-20 MED ORDER — LOSARTAN POTASSIUM 50 MG PO TABS: 100.0000 mg | ORAL_TABLET | Freq: Every day | ORAL | Status: DC

## 2024-02-20 MED ORDER — INSULIN GLARGINE-YFGN 100 UNIT/ML ~~LOC~~ SOLN
20.0000 [IU] | Freq: Every day | SUBCUTANEOUS | Status: DC
  Administered 2024-02-21: 20 [IU] via SUBCUTANEOUS
  Filled 2024-02-20 (×2): qty 0.2

## 2024-02-20 NOTE — ED Notes (Signed)
 Patient belongings: Pink pants Pink underwear  2 black socks 2 black tennis shoes 1 white shirt 1 black jacket 1 cream bra 2 necklaces 1 bracelet  1 black hat

## 2024-02-20 NOTE — ED Notes (Signed)
VOL/Psych Consult Ordered/Pending
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
## 2024-02-20 NOTE — BH Assessment (Signed)
 This Clinical research associate contacted [facility] via phone to request an assessment for this patient, request has been made, assessment is currently pending
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
## 2024-02-20 NOTE — ED Notes (Signed)
 Assisted pt to the bathroom, pt arrived in the bathroom and could not sit down as she stated her leg hurt. Pt urinated on floor and on herself at this time. Provided pt with new scrub pants, socks, underwear and wipes to clean herself. Pt is back in bed at this time.
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
## 2024-02-21 ENCOUNTER — Encounter: Payer: Self-pay | Admitting: Psychiatry

## 2024-02-21 DIAGNOSIS — G809 Cerebral palsy, unspecified: Secondary | ICD-10-CM

## 2024-02-21 DIAGNOSIS — F79 Unspecified intellectual disabilities: Secondary | ICD-10-CM

## 2024-02-21 LAB — CARBAMAZEPINE LEVEL, TOTAL: Carbamazepine Lvl: 5.3 ug/mL (ref 4.0–12.0)

## 2024-02-21 LAB — MAGNESIUM: Magnesium: 2.1 mg/dL (ref 1.7–2.4)

## 2024-02-21 MED ORDER — POTASSIUM CHLORIDE 20 MEQ PO PACK
60.0000 meq | PACK | Freq: Once | ORAL | Status: AC
  Administered 2024-02-21: 60 meq via ORAL
  Filled 2024-02-21: qty 3

## 2024-02-21 NOTE — ED Notes (Signed)
 Pt ate 1000 % of her meal.
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
## 2024-02-21 NOTE — ED Notes (Signed)
 DSS worker Alveena called and stated that she was able to get in contact with the group home and they are on the way to get the pt.

## 2024-02-21 NOTE — ED Notes (Signed)
Breakfast tray provided to pt.

## 2024-02-21 NOTE — ED Notes (Signed)
 [facility] RN is looking in [facility] to see if pt belongings are there. Ride is on the way for pt.

## 2024-02-21 NOTE — ED Notes (Signed)
 Pt given snack.

## 2024-02-21 NOTE — ED Notes (Signed)
 Spoke with Trenette [phone] who was supposed to pick pt up this AM. She says someone else is going to pick pt up. That person's name is Lonell, number is [phone]. Called that number, left voicemail.

## 2024-02-21 NOTE — ED Notes (Signed)
 This RN walked pt out to caregiver who is bringing pt back to group home.

## 2024-02-21 NOTE — ED Notes (Signed)
 Patient was given a cup of water at this time.

## 2024-02-21 NOTE — ED Provider Notes (Addendum)
 Emergency Medicine Observation Re-evaluation Note  Rachel [name] is a 63 y.o. female, seen on rounds today.  Pt initially presented to the ED for complaints of Suicidal  Currently, the patient is is no acute distress. Denies any concerns at this time.  Physical Exam  Blood pressure (!) 185/75, pulse (!) 57, temperature 97.9 F (36.6 C), temperature source Oral, resp. rate 18, height 5' 7 (1.702 m), weight 83 kg, SpO2 99%.  Physical Exam: General: No apparent distress Pulm: Normal WOB Neuro: Moving all extremities Psych: Resting comfortably     ED Course / MDM     I have reviewed the labs performed to date as well as medications administered while in observation.  Recent changes in the last 24 hours include: No acute events overnight.  Plan   Current plan: Patient awaiting psychiatric disposition.    [name], [name], DO 02/21/24 0408   6:14 AM  [name]  [name], NP with psychiatry recommends discharging patient back to her group home.  She is here voluntarily at this time.  Patient still complaining of some dysuria but urine from yesterday shows no acute abnormality.  Will add on a urine culture.  Also complaining of leg cramps.  Has been ambulatory.  Potassium slightly low at 3.3.  Will give replacement.  Will check magnesium .  Psychiatry also recommends adding on a Tegretol  level which can be followed up on an outpatient basis by her outpatient doctors but also may be helpful if she returns to the emergency department.  Called lab and they will add this onto blood work from last night.        [name], [name], DO 02/21/24 [phone]

## 2024-02-21 NOTE — Progress Notes (Signed)
 9:47AM:  [facility] followed up with [facility]  [facility] contacted [employer] [employer] ([phone]).  Trenette confirmed that Ms. Lonell would be picking patient up this morning after her meeting.  Patient will be picked up before 12PM today.     [place], [facility] [phone]

## 2024-02-21 NOTE — ED Notes (Signed)
 Pt sitting comfortably. Pt is dressed in her own clothes. Pt is calm and cooperative. Pt is sipping water.

## 2024-02-21 NOTE — ED Notes (Signed)
 Lonell from group home called and is on her way to pick up pt.

## 2024-02-21 NOTE — ED Notes (Signed)
 Talked with DSS about pt discharge. DSS worker will attempt to contact facility.

## 2024-02-21 NOTE — ED Notes (Signed)
 Pt belongings returned. Pt is getting dressed. Belongings returned include:  Pink pants Pink underwear  2 black socks 2 black tennis shoes 1 white shirt 1 black jacket 1 cream bra 2 necklaces 1 bracelet 1 black hat

## 2024-02-22 LAB — URINE CULTURE: Culture: <10000 — AB

## 2024-03-09 ENCOUNTER — Ambulatory Visit
Admission: EM | Admit: 2024-03-09 | Discharge: 2024-03-09 | Disposition: A | Discharge status: Home / Self Care | Attending: Emergency Medicine | Admitting: Emergency Medicine

## 2024-03-09 ENCOUNTER — Telehealth: Payer: Self-pay

## 2024-03-09 DIAGNOSIS — H6121 Impacted cerumen, right ear: Secondary | ICD-10-CM

## 2024-03-09 DIAGNOSIS — E119 Type 2 diabetes mellitus without complications: Secondary | ICD-10-CM

## 2024-03-09 DIAGNOSIS — Z8679 Personal history of other diseases of the circulatory system: Secondary | ICD-10-CM

## 2024-03-09 LAB — GLUCOSE, POCT (MANUAL RESULT ENTRY): POCT Glucose (KUC): 212 mg/dL — AB (ref 70–99)

## 2024-03-09 LAB — POCT URINE DIPSTICK
Bilirubin, UA: NEGATIVE
Blood, UA: NEGATIVE
Glucose, UA: 100 mg/dL — AB
Ketones, POC UA: NEGATIVE mg/dL
Leukocytes, UA: NEGATIVE
Nitrite, UA: NEGATIVE
Protein Ur, POC: NEGATIVE mg/dL
Spec Grav, UA: <=1.005 — AB (ref 1.010–1.025)
Urobilinogen, UA: 0.2 U/dL
pH, UA: 6 (ref 5.0–8.0)

## 2024-03-09 NOTE — Discharge Instructions (Signed)
 Take home meds as prescribed Follow up with PCP If you have chest pain, shortness of  breath , or worsening symptoms go to the emergency room for further evaluation Do Not stick anything in your ear especially Q-tips or Bobby pins

## 2024-03-09 NOTE — ED Triage Notes (Addendum)
 Patient is here with group home care taker. Care take states that BP levels were elvated this morning so she had to bring her in.  7 am 182/102 9:24 183/112 9:45 157/89  Patient has hypertension. And has been given meds. They check BP first then give BP meds. BP stated coming down after meds.   They also want her checked for a UTI.

## 2024-03-09 NOTE — ED Provider Notes (Addendum)
 [facility]    CSN: [id number] Arrival date & time: 03/09/24  1142      History   Chief Complaint Chief Complaint  Patient presents with   Hypertension    HPI Rachel [name] is a 63 y.o. female.   63 year old female, Rachel [name], presents to urgent care with group home caretaker for elevated BP.  Patient has a history of hypertension per caregiver, received BP med and BP has started to come down after taking her medication. Pt denies any chest pain,sob,or palpitations, no HA. No abdominal pain.  Caretaker also wants to get patient checked for UTI due ot urinary frequency, drinks lots of water  PMH: DM, Cerebral palsy, HTN  The history is provided by the patient and a caregiver. No language interpreter was used.    Past Medical History:  Diagnosis Date   Cerebral palsy (HCC)    Diabetes mellitus without complication (HCC)    Hypercholesteremia    Hypertension     Patient Active Problem List   Diagnosis Date Noted   History of hypertension 03/09/2024   Excessive ear wax, right 03/09/2024   Porokeratosis 05/17/2022   Lower extremity edema 08/16/2020   Pain due to onychomycosis of toenails of both feet 02/02/2019   Acute gastroenteritis 01/26/2018   Diabetic polyneuropathy associated with type 2 diabetes mellitus (HCC) 09/12/2015   Intellectual disability 07/13/2015   Aggressive behavior 07/13/2015   Palpitations 07/06/2015   Essential hypertension, benign 07/06/2015   Obesity 07/06/2015   Cerebral palsy (HCC) 12/24/2013   Controlled type 2 diabetes mellitus without complication (HCC) 12/24/2013   HLD (hyperlipidemia) 12/24/2013   BP (high blood pressure) 12/24/2013   Adiposity 12/24/2013   Peripheral nerve disease 12/24/2013    Past Surgical History:  Procedure Laterality Date   DILATION AND CURETTAGE OF UTERUS      OB History   No obstetric history on file.      Home Medications    Prior to Admission medications   Medication Sig Start  Date End Date Taking? Authorizing Provider  acetaminophen  (TYLENOL ) 325 MG tablet Take 325 mg by mouth every 6 (six) hours as needed. 07/12/22  Yes [provider]  Alcohol Swabs (ALCOHOL PREP) 70 % PADS Apply 1 each topically 3 (three) times daily. 06/01/23  Yes [provider]  ARIPiprazole  (ABILIFY ) 20 MG tablet Take 20 mg by mouth daily.    Yes [provider]  camphor-menthol VIKKI) lotion Apply 1 Application topically as needed for itching. ANTI-ITCH CREAM   Yes [provider]  carbamazepine  (TEGRETOL ) 200 MG tablet Take 200 mg by mouth 2 (two) times daily.   Yes [provider]  chlorhexidine (PERIDEX) 0.12 % solution Use as directed 5 mLs in the mouth or throat daily. 05/23/23  Yes [provider]  diltiazem  (CARDIZEM  CD) 360 MG 24 hr capsule Take 360 mg by mouth daily. 10/23/22  Yes [provider]  EMBECTA AUTOSHIELD DUO 30G X 5 MM MISC  09/22/23  Yes [provider]  famotidine  (PEPCID ) 20 MG tablet Take 1 tablet by mouth 2 (two) times daily. 12/19/22  Yes [provider]  fluticasone  (FLONASE ) 50 MCG/ACT nasal spray Place 1 spray into both nostrils daily.   Yes [provider]  furosemide (LASIX) 20 MG tablet Take 1 tablet by mouth daily. 10/23/22  Yes [provider]  hydrALAZINE  (APRESOLINE ) 100 MG tablet Take 1 tablet by mouth 2 (two) times daily. 10/23/22  Yes [provider]  hydrochlorothiazide  (HYDRODIURIL ) 12.5 MG tablet Take 12.5 mg by mouth daily. 12/25/23  Yes [provider]  ibuprofen  (ADVIL ) 800 MG tablet Take 800 mg by mouth every 6 (six) hours as needed.   Yes [provider]  Insulin  Glargine (LANTUS  SOLOSTAR) 100 UNIT/ML Solostar Pen Inject 20 Units into the skin at bedtime.   Yes [provider]  loratadine  (CLARITIN ) 10 MG tablet Take 10 mg by mouth daily.  09/21/14  Yes [provider]  losartan  (COZAAR ) 100 MG tablet Take 1 tablet by  mouth daily. 10/23/22  Yes [provider]  magnesium  oxide (MAG-OX) 400 MG tablet Take 400 mg by mouth daily.   Yes [provider]  metFORMIN  (GLUCOPHAGE ) 1000 MG tablet Take 1,000 mg by mouth 2 (two) times daily with a meal.  02/21/15  Yes [provider]  metoprolol  tartrate (LOPRESSOR ) 100 MG tablet Take 100 mg by mouth 2 (two) times daily.   Yes [provider]  Multiple Vitamin (MULTIVITAMIN WITH MINERALS) TABS tablet Take 1 tablet by mouth daily.   Yes [provider]  ofloxacin (OCUFLOX) 0.3 % ophthalmic solution  07/15/23  Yes [provider]  pioglitazone (ACTOS) 15 MG tablet Take 15 mg by mouth daily.    Yes [provider]  polyethylene glycol (MIRALAX / GLYCOLAX) packet Take 17 g by mouth daily as needed for moderate constipation.   Yes [provider]  potassium chloride  (KLOR-CON  M) 10 MEQ tablet Take 10 mEq by mouth daily. 12/25/23  Yes [provider]  pravastatin  (PRAVACHOL ) 80 MG tablet Take 80 mg by mouth at bedtime.    Yes [provider]  prazosin  (MINIPRESS ) 2 MG capsule Take 1 tablet by mouth 2 (two) times daily. 10/23/22  Yes [provider]  senna-docusate (SENOKOT-S) 8.6-50 MG tablet Take 1 tablet by mouth 2 (two) times daily.   Yes [provider]  Sod Fluoride-Potassium Nitrate (FLUORIMAX 5000 SENSITIVE) 1.1-5 % GEL Place 1 Application onto teeth at bedtime.   Yes [provider]  traZODone  (DESYREL ) 50 MG tablet Take 50 mg by mouth at bedtime.   Yes [provider]  triamcinolone cream (KENALOG) 0.1 % Apply 1 Application topically 2 (two) times daily.   Yes [provider]    Family History Family History  Problem Relation Age of Onset   Breast cancer Neg Hx     Social History Social History   Tobacco Use   Smoking status: Never   Smokeless tobacco: Never  Vaping Use   Vaping status: Never Used  Substance Use Topics   Alcohol  use: No    Alcohol/week: 0.0 standard drinks of alcohol   Drug use: No     Allergies   Ace inhibitors   Review of Systems Review of Systems  Constitutional:  Negative for fever.  Genitourinary:  Positive for frequency. Negative for dysuria.  All other systems reviewed and are negative.    Physical Exam Triage Vital Signs ED Triage Vitals  Encounter Vitals Group     BP 03/09/24 1200 (!) 177/82     Girls Systolic BP Percentile --      Girls Diastolic BP Percentile --      Boys Systolic BP Percentile --      Boys Diastolic BP Percentile --      Pulse Rate 03/09/24 1200 (!) 56     Resp 03/09/24 1200 17     Temp 03/09/24 1200 97.8 F (36.6 C)     Temp  Source 03/09/24 1200 Oral     SpO2 03/09/24 1200 97 %     Weight 03/09/24 1159 182 lb 15.7 oz (83 kg)     Height --      Head Circumference --      Peak Flow --      Pain Score 03/09/24 1159 0     Pain Loc --      Pain Education --      Exclude from Growth Chart --    No data found.  Updated Vital Signs BP (!) 177/82 (BP Location: Left Arm)   Pulse (!) 56   Temp 97.8 F (36.6 C) (Oral)   Resp 17   Wt 182 lb 15.7 oz (83 kg)   SpO2 97%   BMI 28.66 kg/m   Visual Acuity Right Eye Distance:   Left Eye Distance:   Bilateral Distance:    Right Eye Near:   Left Eye Near:    Bilateral Near:     Physical Exam Vitals and nursing note reviewed.  Constitutional:      General: She is not in acute distress.    Appearance: She is well-developed and well-groomed.  HENT:     Head: Normocephalic and atraumatic.  Eyes:     Conjunctiva/sclera: Conjunctivae normal.  Cardiovascular:     Rate and Rhythm: Normal rate and regular rhythm.     Heart sounds: Normal heart sounds. No murmur heard. Pulmonary:     Effort: Pulmonary effort is normal. No respiratory distress.     Breath sounds: Normal breath sounds and air entry.  Abdominal:     Palpations: Abdomen is soft.     Tenderness: There is no abdominal tenderness.   Musculoskeletal:        General: No swelling.     Cervical back: Neck supple.  Skin:    General: Skin is warm and dry.     Capillary Refill: Capillary refill takes less than 2 seconds.  Neurological:     General: No focal deficit present.     Mental Status: She is alert and oriented to person, place, and time.     GCS: GCS eye subscore is 4. GCS verbal subscore is 5. GCS motor subscore is 6.  Psychiatric:        Attention and Perception: Attention normal.        Mood and Affect: Mood normal.        Speech: Speech normal.        Behavior: Behavior normal. Behavior is cooperative.      UC Treatments / Results  Labs (all labs ordered are listed, but only abnormal results are displayed) Labs Reviewed  POCT URINE DIPSTICK - Abnormal; Notable for the following components:      Result Value   Glucose, UA =100 (*)    Spec Grav, UA <=1.005 (*)    All other components within normal limits  GLUCOSE, POCT (MANUAL RESULT ENTRY) - Abnormal; Notable for the following components:   POCT Glucose ([facility]) 212 (*)    All other components within normal limits    EKG   Radiology No results found.  Procedures Procedures (including critical care time)  Medications Ordered in UC Medications - No data to display  Initial Impression / Assessment and Plan / UC Course  I have reviewed the triage vital signs and the nursing notes.  Pertinent labs & imaging results that were available during my care of the patient were reviewed by me and considered in my medical  decision making (see chart for details).  Clinical Course as of 03/09/24 1755  Mon Mar 09, 2024  1245 Ua is negative for uti + glucose in urine, BS is 212 in office which may be contributing to symptoms. Right ear flushed by staff [JD]    Clinical Course User Index [JD] Nairi Oswald, Rilla, NP   Discussed exam findings and plan of care with patient and caregiver, strict go to ER precautions given, right ear wax removed by staff, TM  intact.   Patient and caregiver verbalized understanding to this provider.  Ddx: Acute uti, cerumen buildup, HTN Final Clinical Impressions(s) / UC Diagnoses   Final diagnoses:  Controlled type 2 diabetes mellitus without complication (HCC)  History of hypertension  Excessive ear wax, right     Discharge Instructions      Take home meds as prescribed Follow up with PCP If you have chest pain, shortness of  breath , or worsening symptoms go to the emergency room for further evaluation Do Not stick anything in your ear especially Q-tips or Bobby pins     ED Prescriptions   None    PDMP not reviewed this encounter.   Aminta Rilla, NP 03/09/24 1755    Aminta Rilla, NP 03/09/24 1807

## 2024-04-09 ENCOUNTER — Encounter: Payer: Self-pay | Admitting: Podiatry

## 2024-04-09 ENCOUNTER — Ambulatory Visit: Admitting: Podiatry

## 2024-04-09 DIAGNOSIS — L84 Corns and callosities: Secondary | ICD-10-CM

## 2024-04-09 DIAGNOSIS — E1142 Type 2 diabetes mellitus with diabetic polyneuropathy: Secondary | ICD-10-CM

## 2024-04-09 DIAGNOSIS — B351 Tinea unguium: Secondary | ICD-10-CM

## 2024-04-09 NOTE — Progress Notes (Unsigned)
 "  Subjective:  Patient ID: Rachel [name], female    DOB: March 14, 1961,  MRN: [id number]  Rachel [name] presents to clinic today for at risk foot care with history of diabetic neuropathy and callus(es) right lower extremity and painful thick toenails that are difficult to trim. Painful toenails interfere with ambulation. Aggravating factors include wearing enclosed shoe gear. Pain is relieved with periodic professional debridement. Painful calluses are aggravated when weightbearing with and without shoegear. Pain is relieved with periodic professional debridement. She is accompanied by group home staff member, Miss Alcora. Chief Complaint  Patient presents with   Nail Problem    Thick painful toenails, 3 month follow up    New problem(s): Patient relates some tenderness to tip of right great toe. She had a total matrixectomy performed some time ago.   PCP is [name], MD. ARNETTA 02/21/24.  Allergies[1]  Review of Systems: Negative except as noted in the HPI.  Objective:  There were no vitals filed for this visit. Rachel [name] is a pleasant 65 y.o. female in NAD. AAO x 3.  Vascular Examination: Capillary refill time immediate b/l. Vascular status intact b/l with palpable pedal pulses. Pedal hair diminished b/l. No pain with calf compression b/l. Skin temperature gradient WNL b/l. No cyanosis or clubbing b/l. No ischemia or gangrene noted b/l. No edema noted b/l LE.  Neurological Examination: Sensation grossly intact b/l with 10 gram monofilament. Vibratory sensation intact b/l. Pt has subjective symptoms of neuropathy.  Dermatological Examination: Distal tip right great toe with mild hyperkeratosis to distal tip of digit. Anonychia noted bilateral 5th toes. Nailbed(s) epithelialized.  Evidence of total matrixectomy bilateral great toes and bilateral 4th toes.  Pedal skin dry b/l.  No open wounds. No interdigital macerations.   Toenails 2, 3, 5 b/l thick, discolored, elongated  with subungual debris and pain on dorsal palpation.   Hyperkeratotic lesion(s) posterior heel right foot. No erythema, no edema, no drainage, no fluctuance.  Musculoskeletal Examination: Muscle strength 5/5 to all lower extremity muscle groups bilaterally. HAV with bunion deformity noted b/l LE.SABRA No pain, crepitus or joint limitation noted with ROM b/l LE.  Patient ambulates independently without assistive aids.  Radiographs: None  Assessment/Plan: 1. Pain due to onychomycosis of toenails of both feet   2. Callus   3. Diabetic peripheral neuropathy associated with type 2 diabetes mellitus (HCC)   -Caregiver/provider present with patient on today's visit. -Continue foot and shoe inspections daily. Monitor blood glucose per PCP/Endocrinologist's recommendations. -Patient to continue soft, supportive shoe gear daily. -Mycotic toenails bilateral 2nd toes and bilateral 3rd toes were debrided in length and girth with sterile nail nippers and dremel without iatrogenic bleeding. -Callus(es) right heel pared utilizing sterile scalpel blade without complication or incident. Total number debrided =1. -For dry, cracked heels, patient to apply CeraVe Healing Ointment to both heels once daily. -Dispensed silicone toe cap. Apply to right great toe every morning. Remove every evening. -Patient/POA to call should there be question/concern in the interim.   Return in about 3 months (around 07/08/2024).  Delon LITTIE Merlin, DPM      [facility] LOCATION: [address], [place] [address]  Office [phone]   [facility] LOCATION: [address] [place], [place] [address] Office [phone]      [1]  Allergies Allergen Reactions   Ace Inhibitors Swelling   "

## 2024-07-10 ENCOUNTER — Ambulatory Visit: Admitting: Podiatry
